# Patient Record
Sex: Male | Born: 1955 | Race: White | Hispanic: No | Smoking: Former smoker
Health system: Southern US, Community
[De-identification: ages and names within clinical notes are randomized; demographics above are authoritative.]

## PROBLEM LIST (undated history)

## (undated) DIAGNOSIS — F419 Anxiety disorder, unspecified: Secondary | ICD-10-CM

## (undated) DIAGNOSIS — N200 Calculus of kidney: Secondary | ICD-10-CM

## (undated) HISTORY — DX: Anxiety disorder, unspecified: F41.9

## (undated) HISTORY — DX: Calculus of kidney: N20.0

## (undated) HISTORY — PX: FRACTURE SURGERY: SHX138

---

## 2011-02-04 HISTORY — PX: THYROIDECTOMY: SHX17

## 2016-08-29 ENCOUNTER — Ambulatory Visit (INDEPENDENT_AMBULATORY_CARE_PROVIDER_SITE_OTHER): Payer: 59 | Admitting: Family Medicine

## 2016-08-29 ENCOUNTER — Encounter: Payer: Self-pay | Admitting: Family Medicine

## 2016-08-29 VITALS — BP 133/86 | HR 81 | Temp 98.4°F | Ht 74.0 in | Wt 178.0 lb

## 2016-08-29 DIAGNOSIS — Z125 Encounter for screening for malignant neoplasm of prostate: Secondary | ICD-10-CM

## 2016-08-29 DIAGNOSIS — R002 Palpitations: Secondary | ICD-10-CM | POA: Diagnosis not present

## 2016-08-29 DIAGNOSIS — R5383 Other fatigue: Secondary | ICD-10-CM | POA: Diagnosis not present

## 2016-08-29 DIAGNOSIS — Z1159 Encounter for screening for other viral diseases: Secondary | ICD-10-CM

## 2016-08-29 DIAGNOSIS — Z13220 Encounter for screening for lipoid disorders: Secondary | ICD-10-CM

## 2016-08-29 DIAGNOSIS — Z0000 Encounter for general adult medical examination without abnormal findings: Secondary | ICD-10-CM | POA: Diagnosis not present

## 2016-08-29 DIAGNOSIS — Z114 Encounter for screening for human immunodeficiency virus [HIV]: Secondary | ICD-10-CM

## 2016-08-29 NOTE — Progress Notes (Signed)
BP (!) 156/95   Pulse 81   Temp 98.4 F (36.9 C)   Ht _0  (1.88 m)   Wt 178 lb (80.7 kg)   BMI 22.85 kg/m    Subjective:    Patient ID: Alvin Sanchez, male    DOB: [birth year], 60 y.o.   MRN: [id number]  HPI: Alvin Sanchez is a 60 y.o. male presenting on 08/29/2016 for Establish Care   HPI Well adult exam Patient is coming in today for well adult exam. It has been quite a few years since he had any kind of checkup. He denies any chest pain, shortness of breath, headaches or vision issues, abdominal complaints, diarrhea, nausea, vomiting, or joint issues. He gets some intermittent palpitations that have been increasing in frequency. He denies any chest pain or shortness of breath associated with them. He cannot pinpoint anything that triggers them or anything that makes them go away but they are happening at least a few times a day now. He is also been having some fatigue and feels like he does not wake up well rested at night. He is also had a hemithyroidectomy and does not know if the fatigue could be related to that. He denies any bowel problems or abdominal problems or urinary problems.Initial blood pressure was 156/95 but repeat was 136/83.  Relevant past medical, surgical, family and social history reviewed and updated as indicated. Interim medical history since our last visit reviewed. Allergies and medications reviewed and updated.  Review of Systems  Constitutional: Positive for fatigue. Negative for chills and fever.  Eyes: Negative for discharge.  Respiratory: Negative for chest tightness, shortness of breath and wheezing.   Cardiovascular: Positive for palpitations. Negative for chest pain and leg swelling.  Musculoskeletal: Negative for back pain and gait problem.  Skin: Negative for rash.  Neurological: Negative for dizziness, weakness, light-headedness and headaches.  All other systems reviewed and are negative.   Per HPI unless specifically indicated  above  Social History   Social History  . Marital status: Divorced    Spouse name: N/A  . Number of children: N/A  . Years of education: N/A   Occupational History  . Not on file.   Social History Main Topics  . Smoking status: Former Smoker    Packs/day: 1.50    Years: 20.00    Quit date: 03/28/2015  . Smokeless tobacco: Former Systems developer    Types: Chew    Quit date: 12/05/2012  . Alcohol use No     Comment: quit since 2012  . Drug use: No  . Sexual activity: Not Currently   Other Topics Concern  . Not on file   Social History Narrative  . No narrative on file    Past Surgical History:  Procedure Laterality Date  . FRACTURE SURGERY     toe  . THYROIDECTOMY Right 02/04/2011   Dr. Hardie Lora, [facility]    Family History  Problem Relation Age of Onset  . Alzheimer's disease Mother   . Cancer Father     throat      Medication List       Accurate as of 08/29/16 10:04 AM. Always use your most recent med list.          Krill Oil 500 MG Caps Take 500 mg by mouth daily.   Melatonin 10 MG Tabs Take 10 mg by mouth at bedtime.   psyllium 58.6 % packet Commonly known as:  METAMUCIL Take 1 packet by mouth daily.  Objective:    BP (!) 156/95   Pulse 81   Temp 98.4 F (36.9 C)   Ht _0  (1.88 m)   Wt 178 lb (80.7 kg)   BMI 22.85 kg/m   Wt Readings from Last 3 Encounters:  08/29/16 178 lb (80.7 kg)    Physical Exam  Constitutional: He is oriented to person, place, and time. He appears well-developed and well-nourished. No distress.  Eyes: Conjunctivae are normal. Right eye exhibits no discharge. No scleral icterus.  Neck: Neck supple. No thyromegaly present.  Cardiovascular: Normal rate, regular rhythm, normal heart sounds and intact distal pulses.   No murmur heard. Pulmonary/Chest: Effort normal and breath sounds normal. No respiratory distress. He has no wheezes.  Abdominal: Soft. Bowel sounds are normal. He exhibits no distension.  There is no tenderness. There is no rebound.  Genitourinary: Rectal exam shows external hemorrhoid (Small external hemorrhoids). Rectal exam shows no mass and no tenderness. Prostate is enlarged (Slight enlargement but smooth). Prostate is not tender.  Musculoskeletal: Normal range of motion. He exhibits no edema.  Lymphadenopathy:    He has no cervical adenopathy.  Neurological: He is alert and oriented to person, place, and time. Coordination normal.  Skin: Skin is warm and dry. No rash noted. He is not diaphoretic.  Psychiatric: He has a normal mood and affect. His behavior is normal.  Nursing note and vitals reviewed.   EKG: Normal sinus rhythm    Assessment & Plan:   Problem List Items Addressed This Visit    None    Visit Diagnoses    Well adult exam    -  Primary   Relevant Orders   CMP14+EGFR   Lipid panel   CBC with Differential/Platelet   PSA, total and free   Hepatitis C antibody   HIV antibody   Vitamin B12   Prostate cancer screening       Relevant Orders   PSA, total and free   Lipid screening       Relevant Orders   Lipid panel   Other fatigue       Relevant Orders   CBC with Differential/Platelet   TSH   Vitamin B12   Need for hepatitis C screening test       Relevant Orders   Hepatitis C antibody   Screening for HIV without presence of risk factors       Relevant Orders   HIV antibody   Palpitations       Relevant Orders   Holter monitor - 48 hour   EKG 12-Lead       Follow up plan: Return if symptoms worsen or fail to improve.  Caryl Pina, MD [facility] 08/29/2016, 10:04 AM

## 2016-08-29 NOTE — Progress Notes (Signed)
48 hr Holter placed Asset [phone]#[id number]

## 2016-08-29 NOTE — Addendum Note (Signed)
Addended by: Bearl MulberryUTHERFORD, Janaya Broy K on: 08/29/2016 10:52 AM   Modules accepted: Orders

## 2016-08-30 ENCOUNTER — Telehealth: Payer: Self-pay | Admitting: Family Medicine

## 2016-08-30 LAB — CBC WITH DIFFERENTIAL/PLATELET
BASOS ABS: 0 10*3/uL (ref 0.0–0.2)
Basos: 0 %
EOS (ABSOLUTE): 0.1 10*3/uL (ref 0.0–0.4)
Eos: 3 %
HEMATOCRIT: 38.4 % (ref 37.5–51.0)
Hemoglobin: 13.1 g/dL (ref 12.6–17.7)
Immature Grans (Abs): 0 10*3/uL (ref 0.0–0.1)
Immature Granulocytes: 0 %
LYMPHS ABS: 1 10*3/uL (ref 0.7–3.1)
Lymphs: 21 %
MCH: 30.3 pg (ref 26.6–33.0)
MCHC: 34.1 g/dL (ref 31.5–35.7)
MCV: 89 fL (ref 79–97)
MONOS ABS: 0.2 10*3/uL (ref 0.1–0.9)
Monocytes: 5 %
NEUTROS ABS: 3.6 10*3/uL (ref 1.4–7.0)
Neutrophils: 71 %
Platelets: 208 10*3/uL (ref 150–379)
RBC: 4.32 x10E6/uL (ref 4.14–5.80)
RDW: 13.1 % (ref 12.3–15.4)
WBC: 5 10*3/uL (ref 3.4–10.8)

## 2016-08-30 LAB — CMP14+EGFR
A/G RATIO: 2 (ref 1.2–2.2)
ALT: 17 IU/L (ref 0–44)
AST: 16 IU/L (ref 0–40)
Albumin: 4.5 g/dL (ref 3.6–4.8)
Alkaline Phosphatase: 60 IU/L (ref 39–117)
BUN/Creatinine Ratio: 16 (ref 10–24)
BUN: 12 mg/dL (ref 8–27)
Bilirubin Total: 0.3 mg/dL (ref 0.0–1.2)
CALCIUM: 8.9 mg/dL (ref 8.6–10.2)
CO2: 26 mmol/L (ref 18–29)
CREATININE: 0.75 mg/dL — AB (ref 0.76–1.27)
Chloride: 102 mmol/L (ref 96–106)
GFR, EST AFRICAN AMERICAN: 114 mL/min/{1.73_m2} (ref >59)
GFR, EST NON AFRICAN AMERICAN: 99 mL/min/{1.73_m2} (ref >59)
GLUCOSE: 95 mg/dL (ref 65–99)
Globulin, Total: 2.2 g/dL (ref 1.5–4.5)
Potassium: 4.1 mmol/L (ref 3.5–5.2)
Sodium: 141 mmol/L (ref 134–144)
TOTAL PROTEIN: 6.7 g/dL (ref 6.0–8.5)

## 2016-08-30 LAB — LIPID PANEL
CHOL/HDL RATIO: 3.1 ratio (ref 0.0–5.0)
Cholesterol, Total: 162 mg/dL (ref 100–199)
HDL: 53 mg/dL (ref >39)
LDL CALC: 81 mg/dL (ref 0–99)
Triglycerides: 138 mg/dL (ref 0–149)
VLDL CHOLESTEROL CAL: 28 mg/dL (ref 5–40)

## 2016-08-30 LAB — VITAMIN B12: Vitamin B-12: 455 pg/mL (ref 211–946)

## 2016-08-30 LAB — TSH: TSH: 1.06 u[IU]/mL (ref 0.450–4.500)

## 2016-08-30 LAB — HIV ANTIBODY (ROUTINE TESTING W REFLEX): HIV Screen 4th Generation wRfx: NONREACTIVE

## 2016-08-30 LAB — PSA, TOTAL AND FREE
PROSTATE SPECIFIC AG, SERUM: 1.8 ng/mL (ref 0.0–4.0)
PSA FREE: 0.22 ng/mL
PSA, Free Pct: 12.2 %

## 2016-08-30 LAB — HEPATITIS C ANTIBODY: Hep C Virus Ab: 0.1 s/co ratio (ref 0.0–0.9)

## 2016-08-30 NOTE — Telephone Encounter (Signed)
Labs mailed

## 2016-09-01 NOTE — Telephone Encounter (Signed)
We received results of heart monitor.  Per Dr. Louanne Skyeettinger, results show PVC's and trigeminy.  Nothing to worry about.  He can be started on a beta blocker if the symptoms are bothering him but no danger.

## 2016-09-01 NOTE — Telephone Encounter (Signed)
Patient's caregiver aware of heart monitor results

## 2016-10-19 ENCOUNTER — Ambulatory Visit: Payer: 59 | Admitting: Family Medicine

## 2016-10-20 ENCOUNTER — Ambulatory Visit (INDEPENDENT_AMBULATORY_CARE_PROVIDER_SITE_OTHER): Payer: 59 | Admitting: Family Medicine

## 2016-10-20 ENCOUNTER — Encounter: Payer: Self-pay | Admitting: Family Medicine

## 2016-10-20 VITALS — BP 135/75 | HR 66 | Temp 98.9°F | Ht 74.0 in | Wt 178.2 lb

## 2016-10-20 DIAGNOSIS — K5732 Diverticulitis of large intestine without perforation or abscess without bleeding: Secondary | ICD-10-CM | POA: Diagnosis not present

## 2016-10-20 DIAGNOSIS — N5089 Other specified disorders of the male genital organs: Secondary | ICD-10-CM

## 2016-10-20 DIAGNOSIS — N509 Disorder of male genital organs, unspecified: Secondary | ICD-10-CM | POA: Diagnosis not present

## 2016-10-20 MED ORDER — CIPROFLOXACIN HCL 500 MG PO TABS: 500.0000 mg | ORAL_TABLET | Freq: Two times a day (BID) | ORAL | 0 refills | Status: AC

## 2016-10-20 MED ORDER — METRONIDAZOLE 500 MG PO TABS: 500.0000 mg | ORAL_TABLET | Freq: Two times a day (BID) | ORAL | 0 refills | Status: AC

## 2016-10-20 NOTE — Progress Notes (Signed)
BP 135/75   Pulse 66   Temp 98.9 F (37.2 C) (Oral)   Ht 6\' 2"  (1.88 m)   Wt 178 lb 4 oz (80.9 kg)   BMI 22.89 kg/m    Subjective:    Patient ID: Alvin Sanchez, male    DOB: 09-28-1956, 60 y.o.   MRN: [id number]  HPI: Alvin CharityJimmy Lee Gilberti is a 60 y.o. male presenting on 10/20/2016 for Abdominal Pain (began 2 - 3 weeks ago, lower abdomen, with episodes of diarrhea)   HPI Diarrhea Patient has been having diarrhea and left lower abdominal pain that is been going on for the past 2 and half weeks. He says it initially started with 10-15 episodes of diarrhea daily and it was very severe and instant and over the past few days he is having 2-5 a day. He did wake up today and is artery had to just this morning of looser stools. They're not watery and there is no blood involved. He denies any nausea or vomiting. He is still mostly eating fine and denies any fevers or chills. He denies any sick contacts that he knows of.  Testicular mass. A few months ago patient noticed that he had a testicular mass just above his right testicle and it has been there for that amount of time. It has been unchanged. It is very firm and somewhat tender to palpation. He has never had anything like this before.  Relevant past medical, surgical, family and social history reviewed and updated as indicated. Interim medical history since our last visit reviewed. Allergies and medications reviewed and updated.  Review of Systems  Constitutional: Negative for chills and fever.  Eyes: Negative for discharge.  Respiratory: Negative for shortness of breath and wheezing.   Cardiovascular: Negative for chest pain and leg swelling.  Gastrointestinal: Positive for abdominal pain and diarrhea. Negative for blood in stool, constipation, nausea and vomiting.  Genitourinary: Negative for dysuria, flank pain and frequency.  Musculoskeletal: Negative for back pain and gait problem.  Skin: Negative for rash.  All other systems  reviewed and are negative.  Per HPI unless specifically indicated above     Objective:    BP 135/75   Pulse 66   Temp 98.9 F (37.2 C) (Oral)   Ht 6\' 2"  (1.88 m)   Wt 178 lb 4 oz (80.9 kg)   BMI 22.89 kg/m   Wt Readings from Last 3 Encounters:  10/20/16 178 lb 4 oz (80.9 kg)  08/29/16 178 lb (80.7 kg)    Physical Exam  Constitutional: He is oriented to person, place, and time. He appears well-developed and well-nourished. No distress.  Eyes: Conjunctivae are normal. Right eye exhibits no discharge. Left eye exhibits no discharge. No scleral icterus.  Cardiovascular: Normal rate, regular rhythm, normal heart sounds and intact distal pulses.   No murmur heard. Pulmonary/Chest: Effort normal and breath sounds normal. No respiratory distress. He has no wheezes. He has no rales.  Abdominal: Soft. Bowel sounds are normal. He exhibits no distension. There is tenderness (Mild left lower quadrant abdominal pressure). There is no rebound and no guarding. Hernia confirmed negative in the right inguinal area and confirmed negative in the left inguinal area.  Genitourinary: Penis normal. Right testis shows mass (small firm nodule in right testicle on the superior side). Right testis shows no swelling and no tenderness. Right testis is descended. Left testis shows no mass, no swelling and no tenderness. Left testis is descended. Circumcised.  Musculoskeletal: Normal range of  motion. He exhibits no edema.  Lymphadenopathy:       Right: No inguinal adenopathy present.       Left: No inguinal adenopathy present.  Neurological: He is alert and oriented to person, place, and time. Coordination normal.  Skin: Skin is warm and dry. No rash noted. He is not diaphoretic.  Psychiatric: He has a normal mood and affect. His behavior is normal.  Nursing note and vitals reviewed.     Assessment & Plan:   Problem List Items Addressed This Visit    None    Visit Diagnoses    Diverticulitis of colon    -   Primary   Relevant Medications   ciprofloxacin (CIPRO) 500 MG tablet   metroNIDAZOLE (FLAGYL) 500 MG tablet   Testicular mass       Relevant Orders   US Art/Ven Flow Abd Pelv Doppler   US Scrotum       Follow up plan: Return if symptoms worsen or fail to improve.  Counseling provided for all of the vaccine components Orders Placed This Encounter  Procedures  . US Art/Ven Flow Abd Pelv Doppler  . US Scrotum    Arville CareJoshua Liz Pinho, MD [facility] 10/20/2016, 8:56 AM

## 2016-10-24 ENCOUNTER — Ambulatory Visit (HOSPITAL_COMMUNITY)
Admission: RE | Admit: 2016-10-24 | Discharge: 2016-10-24 | Disposition: A | Discharge status: Home / Self Care | Payer: 59 | Source: Ambulatory Visit | Attending: Family Medicine | Admitting: Family Medicine

## 2016-10-24 DIAGNOSIS — N503 Cyst of epididymis: Secondary | ICD-10-CM | POA: Diagnosis not present

## 2016-10-24 DIAGNOSIS — N433 Hydrocele, unspecified: Secondary | ICD-10-CM | POA: Diagnosis not present

## 2016-10-24 DIAGNOSIS — N509 Disorder of male genital organs, unspecified: Secondary | ICD-10-CM | POA: Insufficient documentation

## 2016-10-24 DIAGNOSIS — N5089 Other specified disorders of the male genital organs: Secondary | ICD-10-CM

## 2017-10-13 IMAGING — US US ART/VEN ABD/PELV/SCROTUM DOPPLER LTD
1 series · 13 of 25 positions shown · non-contrast
Comparison: None in PACs

CLINICAL DATA: Tender scrotal mass superior to the right testicle
for the past 2 months.

EXAM:
SCROTAL ULTRASOUND
DOPPLER ULTRASOUND OF THE TESTICLES
TECHNIQUE: Complete ultrasound examination of the testicles, epididymis, and
other scrotal structures was performed. Color and spectral Doppler
ultrasound were also utilized to evaluate blood flow to the
testicles.

[Series 1: us art/ven abd/pelv/scrotum doppler ltd · 0.07mm/px · 13 of 69 slices shown]
[im 1/69]
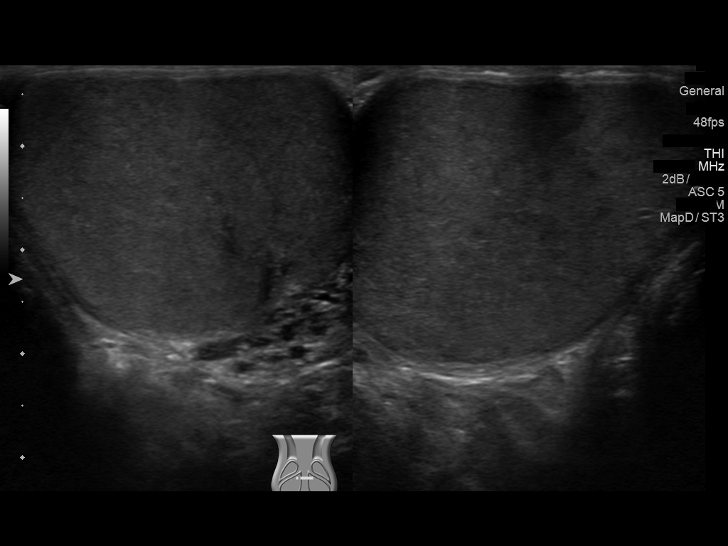
[im 6/69]
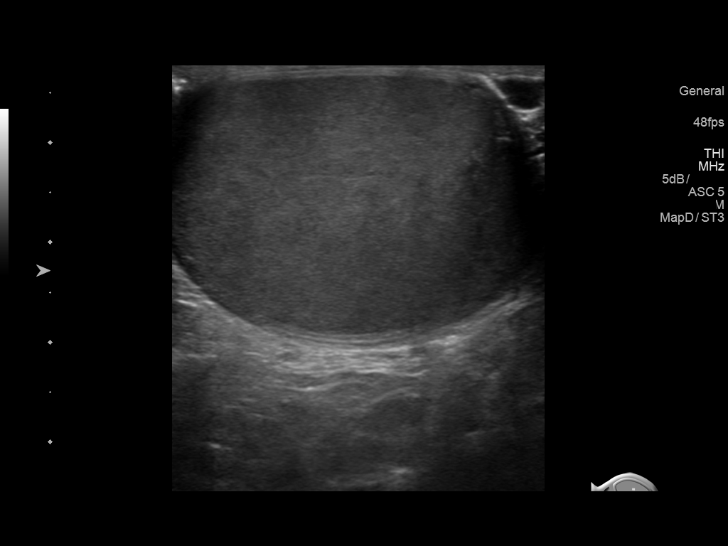
[im 12/69]
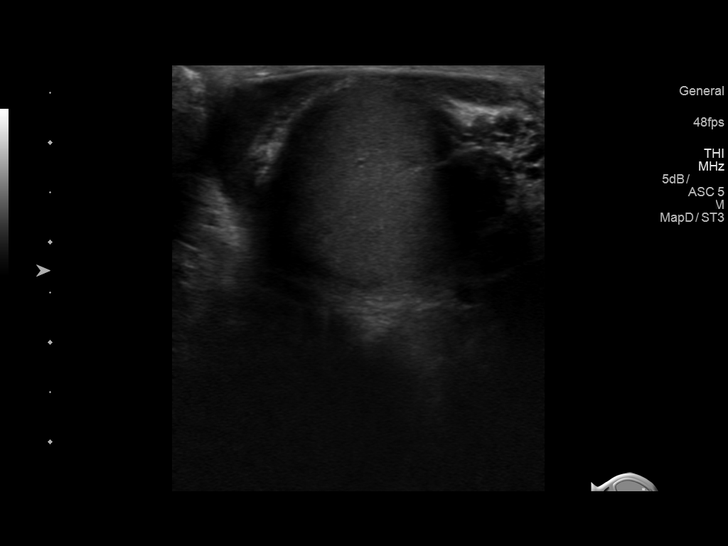
[im 18/69]
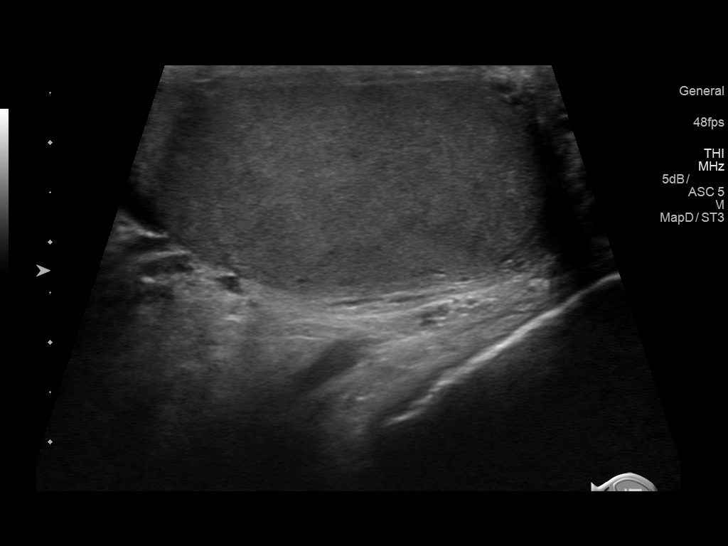
[im 23/69]
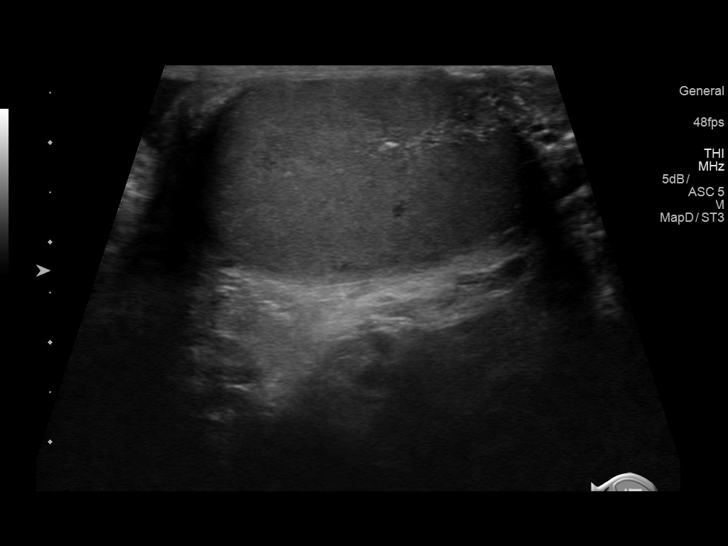
[im 29/69]
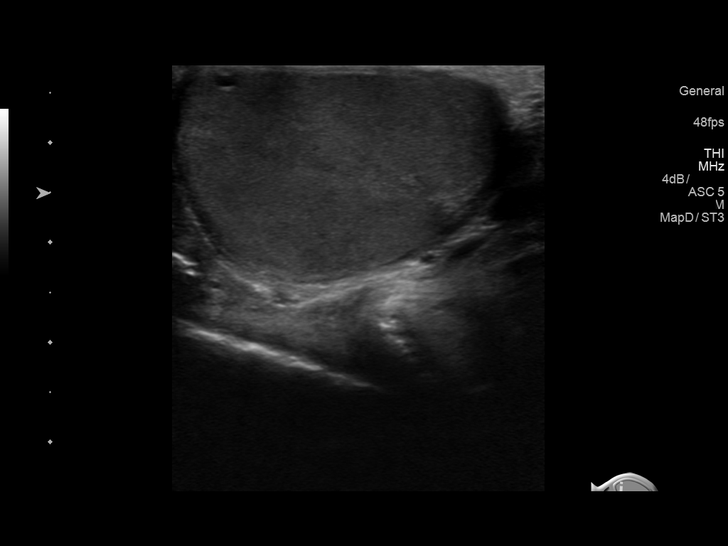
[im 35/69]
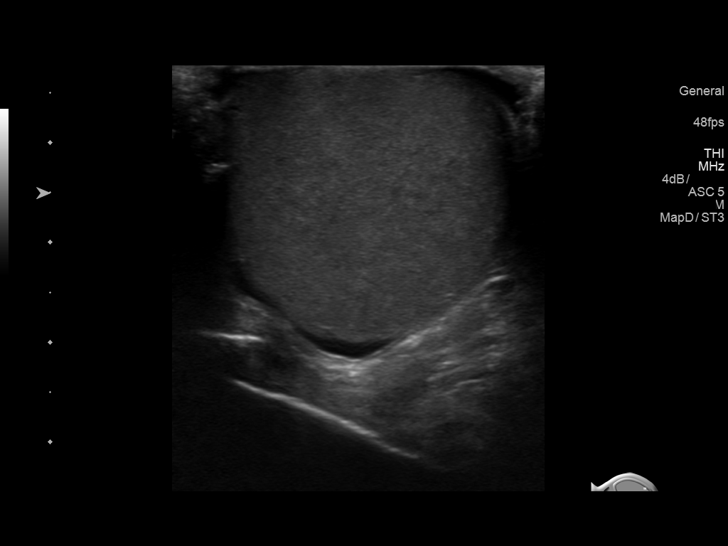
[im 40/69]
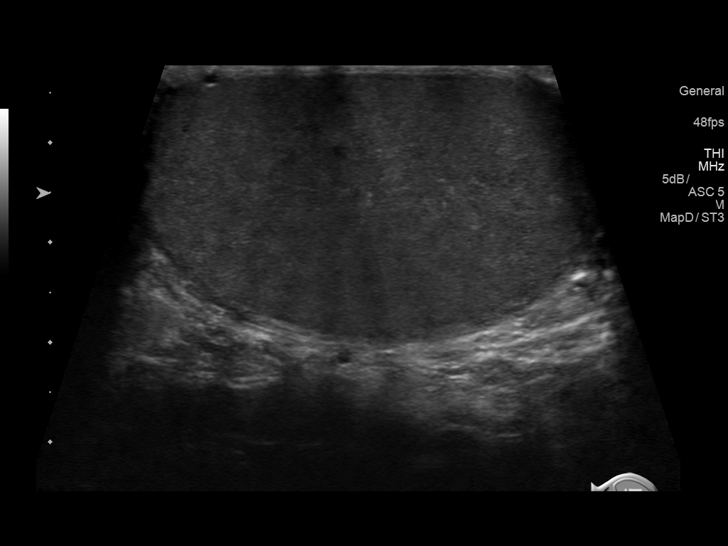
[im 46/69]
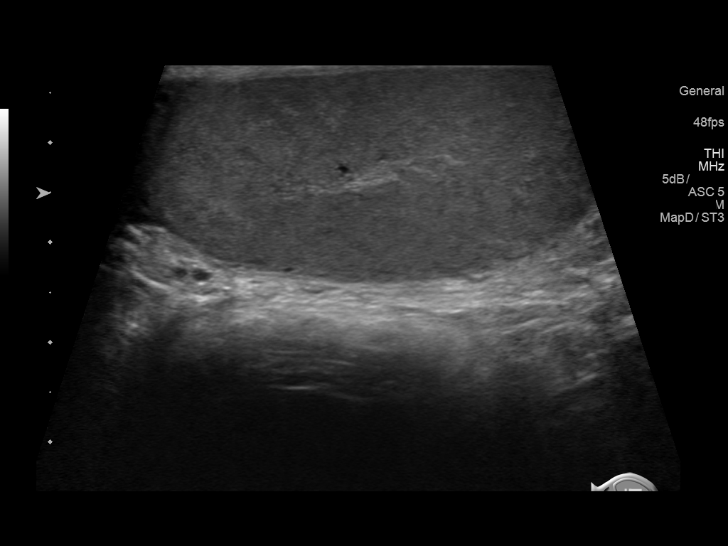
[im 52/69]
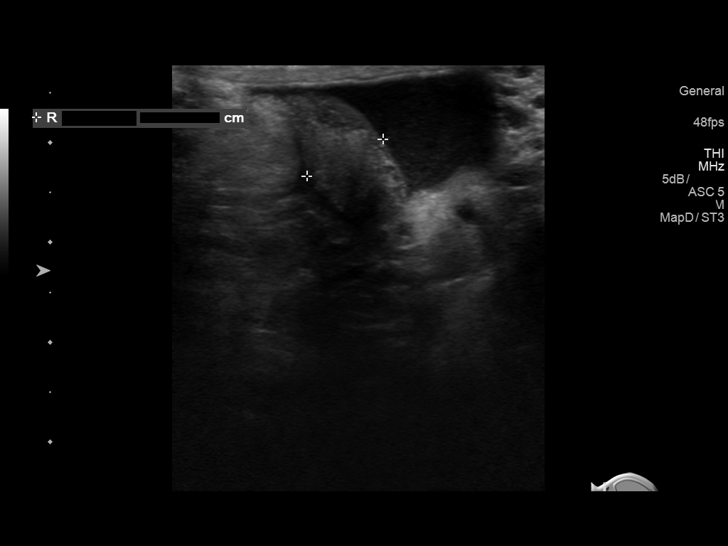
[im 57/69]
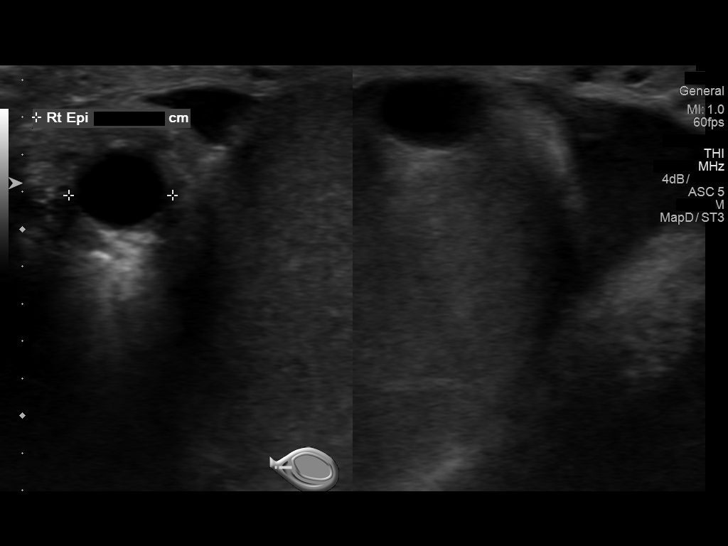
[im 63/69]
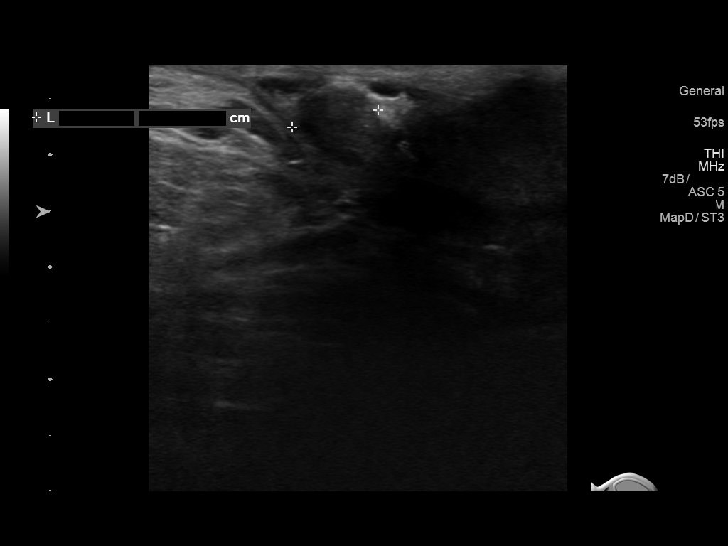
[im 69/69]
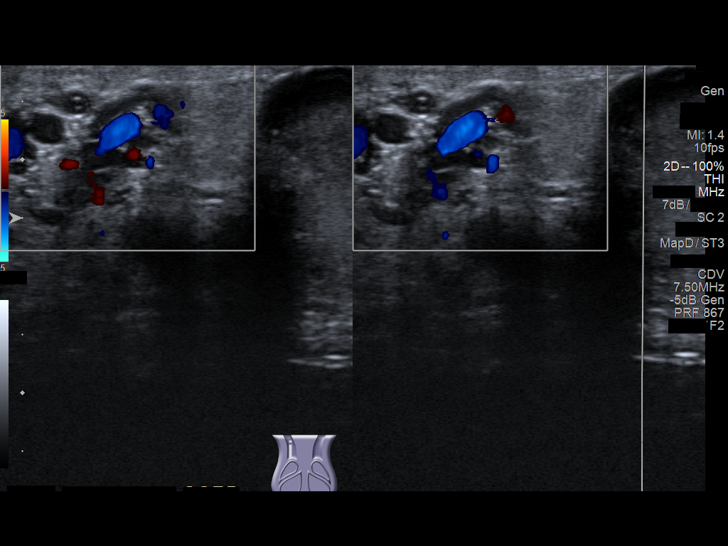

[13 of 25 positions shown; findings below may reference images not displayed]

FINDINGS: Right testicle

Measurements: 4.4 x 2.3 x 3.6 cm. No mass or microlithiasis
visualized.

Left testicle

Measurements: 4.8 x 2.7 x 3.6 cm. No mass or microlithiasis
visualized.

Right epididymis: There is a 6 mm diameter right-sided epididymal
cyst which corresponds to the palpable area of focal tenderness. The
epididymal tissue elsewhere appears normal.

Left epididymis:  Normal in size and appearance.

Hydrocele:  There are small bilateral hydroceles

Varicocele:  None visualized.

Pulsed Doppler interrogation of both testes demonstrates normal low
resistance arterial and venous waveforms bilaterally.
IMPRESSION: 1. There is no right intra testicular mass or evidence of orchitis
or torsion.
2. There is a 6 mm right-sided epididymal cyst which corresponds to
the area of palpable tenderness. No vascular changes to suggest
acute epididymitis.
3. Normal appearance of the left testicle and left epididymis.
4. Small bilateral hydroceles.

## 2019-12-10 HISTORY — PX: ROTATOR CUFF REPAIR: SHX139

## 2019-12-25 ENCOUNTER — Other Ambulatory Visit: Payer: Self-pay

## 2019-12-25 ENCOUNTER — Encounter: Payer: Self-pay | Admitting: Physical Therapy

## 2019-12-25 ENCOUNTER — Ambulatory Visit: Payer: Worker's Compensation | Attending: Orthopedic Surgery | Admitting: Physical Therapy

## 2019-12-25 DIAGNOSIS — M25612 Stiffness of left shoulder, not elsewhere classified: Secondary | ICD-10-CM | POA: Diagnosis not present

## 2019-12-25 DIAGNOSIS — M6281 Muscle weakness (generalized): Secondary | ICD-10-CM | POA: Diagnosis present

## 2019-12-25 NOTE — Therapy (Signed)
[facility] [address] [place], [place], [address] Phone: [phone]   Fax:  [phone]  Physical Therapy Evaluation  Patient Details  Name: Alvin Sanchez MRN: [id number] Date of Birth: 08/04/1956 Referring Provider (PT): Jones Broom, MD   Encounter Date: 12/25/2019  PT End of Session - 12/25/19 1633    Visit Number  1    Number of Visits  18    Date for PT Re-Evaluation  02/05/20    Authorization Type  Worker's compensation    Authorization - Visit Number  1    Authorization - Number of Visits  18    PT Start Time  0815    PT Stop Time  0858    PT Time Calculation (min)  43 min    Equipment Utilized During Treatment  Other (comment)   left sling   Activity Tolerance  Patient tolerated treatment well    Behavior During Therapy  [facility] for tasks assessed/performed       Past Medical History:  Diagnosis Date  . Anxiety   . Kidney stones     Past Surgical History:  Procedure Laterality Date  . FRACTURE SURGERY     toe  . ROTATOR CUFF REPAIR Left 12/10/2019  . THYROIDECTOMY Right 02/04/2011   Dr. Sandi Carne, [facility]    There were no vitals filed for this visit.   Subjective Assessment - 12/25/19 1624    Subjective  COVID-19 screening performed upon arrival. Patient arrives to physical therapy with reports of difficulty performing ADLs secondary to a left rotator cuff repair on 12/10/2019. Patient reports he has minimal to no pain in his left shoulder. He is modified independent for dressing and requires increased time to perform. Patient requires assistance for showering activities. Patient reports independence with donning and doffing sling as well as independence with HEP provided by MD. Patient reports short sharp pain if he moves his shoulder in the wrong way but dissipates quickly. Patient's pain at rest is 0/10. Patient's goals are to improve movement, improve strength, and return to work.    Pertinent  History  left RTC repair 12/10/2019    Limitations  Lifting;House hold activities    Patient Stated Goals  use arm normally again.    Currently in Pain?  No/denies         [facility] PT Assessment - 12/25/19 0001      Assessment   Medical Diagnosis  Traumatic rotator cuff tear left; shoulder impingement, left    Referring Provider (PT)  Jones Broom, MD    Onset Date/Surgical Date  12/10/19    Hand Dominance  Right    Next MD Visit  01/15/2020    Prior Therapy  no      Precautions   Precautions  Shoulder    Precaution Comments  Follow RTC protocol see media      Balance Screen   Has the patient fallen in the past 6 months  Yes    How many times?  1    Has the patient had a decrease in activity level because of a fear of falling?   No    Is the patient reluctant to leave their home because of a fear of falling?   No      Home Environment   Living Environment  Private residence      Prior Function   Level of Independence  Needs assistance with ADLs      Observation/Other Assessments   Focus on Therapeutic  Outcomes (FOTO)   80% limitation      ROM / Strength   AROM / PROM / Strength  PROM      PROM   Overall PROM   Deficits;Due to precautions    PROM Assessment Site  Shoulder    Right/Left Shoulder  Left    Left Shoulder Flexion  90 Degrees    Left Shoulder Internal Rotation  --   to abdomen   Left Shoulder External Rotation  20 Degrees      Palpation   Palpation comment  no tenderness to palpation to left shoulder                Objective measurements completed on examination: See above findings.              PT Education - 12/25/19 1633    Education Details  pendulums, scapular retractions, scapular depression    Person(s) Educated  Patient    Methods  Explanation;Demonstration;Handout    Comprehension  Verbalized understanding;Returned demonstration          PT Long Term Goals - 12/25/19 1638      PT LONG TERM GOAL #1   Title   Patient will be independent with HEP    Time  6    Period  Weeks    Status  New      PT LONG TERM GOAL #2   Title  Patient will demonstrate 140+ degrees of left shoulder flexion AROM to improve ability to perform overhead tasks.    Time  6    Period  Weeks    Status  New      PT LONG TERM GOAL #3   Title  Patient will demonstrate 55+ degrees of left shoulder ER AROM to improve donning and doffing apparel.    Time  6    Period  Weeks    Status  New      PT LONG TERM GOAL #4   Title  Patient will demonstrate 4/5 or greater left shoulder MMT in all planes to improve stability during functional tasks.    Time  6    Period  Weeks    Status  New      PT LONG TERM GOAL #5   Title  Patient will be independent with ADLs and with pain less than or equal to 2/10 in left shoulder.    Time  6    Period  Weeks    Status  New             Plan - 12/25/19 1634    Clinical Impression Statement  Patient is a 64 year old right handed male who presents to physical therapy with limited left shoulder ROM and strength due to a left shoulder RTC repair on 12/10/2019. Patient was able to achieve ranges into flexion and ER per protocol with no reports of pain. Patient noted with rounded shoulders and forward head. Patient provided HEP as well as educated on plan of care to address deficits. Patient reported understanding. Patient would benefit from skilled physical therapy to address deficits and address patient's goals.    Personal Factors and Comorbidities  Age;Comorbidity 1    Comorbidities  left RTC repair 12/10/2019    Examination-Activity Limitations  Bathing;Reach Overhead;Carry;Dressing;Hygiene/Grooming;Lift    Stability/Clinical Decision Making  Stable/Uncomplicated    Clinical Decision Making  Low    Rehab Potential  Excellent    PT Frequency  3x / week    PT Duration  6 weeks    PT Treatment/Interventions  ADLs/Self Care Home Management;Cryotherapy;Electrical Stimulation;Moist  Heat;Ultrasound;Neuromuscular re-education;Therapeutic activities;Therapeutic exercise;Functional mobility training;Patient/family education;Manual techniques;Vasopneumatic Device;Taping;Passive range of motion    PT Next Visit Plan  PROM to left shoulder, NO ER >20 DEGREES FOR 6 WEEKS PER PROTOCOL, modalities PRN for pain relief    PT Home Exercise Plan  see patient education section    Consulted and Agree with Plan of Care  Patient       Patient will benefit from skilled therapeutic intervention in order to improve the following deficits and impairments:  Decreased activity tolerance, Decreased strength, Decreased range of motion, Pain, Impaired UE functional use, Postural dysfunction  Visit Diagnosis: Stiffness of left shoulder, not elsewhere classified - Plan: PT plan of care cert/re-cert  Muscle weakness (generalized) - Plan: PT plan of care cert/re-cert     Problem List There are no problems to display for this patient.   [name], PT, DPT 12/25/2019, 4:43 PM  [facility] [address] [place], [place], [address] Phone: [phone]   Fax:  [phone]  Name: Alvin Sanchez MRN: [id number] Date of Birth: July 14, 1956

## 2019-12-27 ENCOUNTER — Ambulatory Visit: Payer: Worker's Compensation | Admitting: Physical Therapy

## 2019-12-27 ENCOUNTER — Other Ambulatory Visit: Payer: Self-pay

## 2019-12-27 DIAGNOSIS — M25612 Stiffness of left shoulder, not elsewhere classified: Secondary | ICD-10-CM | POA: Diagnosis not present

## 2019-12-27 DIAGNOSIS — M6281 Muscle weakness (generalized): Secondary | ICD-10-CM

## 2019-12-27 NOTE — Therapy (Signed)
[facility] [address] [place], [place], [address] Phone: [phone]   Fax:  [phone]  Physical Therapy Treatment  Patient Details  Name: Alvin Sanchez MRN: [id number] Date of Birth: 05/20/1956 Referring Provider (PT): Jones Broom, MD   Encounter Date: 12/27/2019  PT End of Session - 12/27/19 1241    Visit Number  2    Number of Visits  18    Date for PT Re-Evaluation  02/05/20    Authorization Type  Worker's compensation    Authorization - Visit Number  2    Authorization - Number of Visits  18    PT Start Time  0945    PT Stop Time  1038    PT Time Calculation (min)  53 min    Equipment Utilized During Treatment  Other (comment)   left sling   Activity Tolerance  Patient tolerated treatment well    Behavior During Therapy  [facility] for tasks assessed/performed       Past Medical History:  Diagnosis Date  . Anxiety   . Kidney stones     Past Surgical History:  Procedure Laterality Date  . FRACTURE SURGERY     toe  . ROTATOR CUFF REPAIR Left 12/10/2019  . THYROIDECTOMY Right 02/04/2011   Dr. Sandi Carne, [facility]    There were no vitals filed for this visit.  Subjective Assessment - 12/27/19 1242    Subjective  COVID-19 screening performed upon arrival. Patient reported no complaints and reported being complaint with HEP.    Pertinent History  left RTC repair 12/10/2019    Limitations  Lifting;House hold activities    Patient Stated Goals  use arm normally again.    Currently in Pain?  No/denies         [facility] PT Assessment - 12/27/19 0001      Assessment   Medical Diagnosis  Traumatic rotator cuff tear left; shoulder impingement, left    Referring Provider (PT)  Jones Broom, MD    Onset Date/Surgical Date  12/10/19    Hand Dominance  Right    Next MD Visit  01/15/2020    Prior Therapy  no      Precautions   Precautions  Shoulder    Precaution Comments  Follow RTC protocol see media                    [facility] Adult PT Treatment/Exercise - 12/27/19 0001      Exercises   Exercises  Shoulder      Modalities   Modalities  Vasopneumatic      Vasopneumatic   Number Minutes Vasopneumatic   15 minutes    Vasopnuematic Location   Shoulder    Vasopneumatic Pressure  Low    Vasopneumatic Temperature   56      Manual Therapy   Manual Therapy  Passive ROM    Passive ROM  left shoulder PROM into flexion and ER into 20 degrees per protocol. intermittent oscillations for muscle relaxation                  PT Long Term Goals - 12/25/19 1638      PT LONG TERM GOAL #1   Title  Patient will be independent with HEP    Time  6    Period  Weeks    Status  New      PT LONG TERM GOAL #2   Title  Patient will demonstrate 140+ degrees of left  shoulder flexion AROM to improve ability to perform overhead tasks.    Time  6    Period  Weeks    Status  New      PT LONG TERM GOAL #3   Title  Patient will demonstrate 55+ degrees of left shoulder ER AROM to improve donning and doffing apparel.    Time  6    Period  Weeks    Status  New      PT LONG TERM GOAL #4   Title  Patient will demonstrate 4/5 or greater left shoulder MMT in all planes to improve stability during functional tasks.    Time  6    Period  Weeks    Status  New      PT LONG TERM GOAL #5   Title  Patient will be independent with ADLs and with pain less than or equal to 2/10 in left shoulder.    Time  6    Period  Weeks    Status  New            Plan - 12/27/19 1243    Clinical Impression Statement  Patient responded well to therapy session with no reports of increased pain just a slight "pulling" during PROM. Patient noted with smooth arcs of motion and was able to achieve 20 degrees of ER per protocol with no complaints. Patient educated on vasopneumatic device for edema control. Patient reported understanding and was found with normal responses upon removal.    Personal Factors and  Comorbidities  Age;Comorbidity 1    Comorbidities  left RTC repair 12/10/2019    Examination-Activity Limitations  Bathing;Reach Overhead;Carry;Dressing;Hygiene/Grooming;Lift    Stability/Clinical Decision Making  Stable/Uncomplicated    Clinical Decision Making  Low    Rehab Potential  Excellent    PT Frequency  3x / week    PT Treatment/Interventions  ADLs/Self Care Home Management;Cryotherapy;Electrical Stimulation;Moist Heat;Ultrasound;Neuromuscular re-education;Therapeutic activities;Therapeutic exercise;Functional mobility training;Patient/family education;Manual techniques;Vasopneumatic Device;Taping;Passive range of motion    PT Next Visit Plan  PROM to left shoulder, NO ER >20 DEGREES FOR 6 WEEKS PER PROTOCOL, modalities PRN for pain relief    PT Home Exercise Plan  see patient education section    Consulted and Agree with Plan of Care  Patient       Patient will benefit from skilled therapeutic intervention in order to improve the following deficits and impairments:  Decreased activity tolerance, Decreased strength, Decreased range of motion, Pain, Impaired UE functional use, Postural dysfunction  Visit Diagnosis: Stiffness of left shoulder, not elsewhere classified  Muscle weakness (generalized)     Problem List There are no problems to display for this patient.   [name], PT, DPT 12/27/2019, 12:48 PM  [facility] [address] [place], [place], [address] Phone: [phone]   Fax:  [phone]  Name: Kamaron Deskins MRN: [id number] Date of Birth: Oct 18, 1956

## 2019-12-30 ENCOUNTER — Ambulatory Visit: Payer: Worker's Compensation | Admitting: Physical Therapy

## 2019-12-30 ENCOUNTER — Other Ambulatory Visit: Payer: Self-pay

## 2019-12-30 ENCOUNTER — Encounter: Payer: Self-pay | Admitting: Physical Therapy

## 2019-12-30 DIAGNOSIS — M6281 Muscle weakness (generalized): Secondary | ICD-10-CM

## 2019-12-30 DIAGNOSIS — M25612 Stiffness of left shoulder, not elsewhere classified: Secondary | ICD-10-CM

## 2019-12-30 NOTE — Therapy (Signed)
[facility] [address] [place], [place], [address] Phone: [phone]   Fax:  [phone]  Physical Therapy Treatment  Patient Details  Name: Alvin Sanchez MRN: [id number] Date of Birth: March 06, 1956 Referring Provider (PT): Jones Broom, MD   Encounter Date: 12/30/2019  PT End of Session - 12/30/19 0750    Visit Number  3    Number of Visits  18    Date for PT Re-Evaluation  02/05/20    Authorization Type  Worker's compensation    Authorization - Visit Number  3    Authorization - Number of Visits  18    PT Start Time  0736    PT Stop Time  0817    PT Time Calculation (min)  41 min    Activity Tolerance  Patient tolerated treatment well    Behavior During Therapy  [facility] for tasks assessed/performed       Past Medical History:  Diagnosis Date  . Anxiety   . Kidney stones     Past Surgical History:  Procedure Laterality Date  . FRACTURE SURGERY     toe  . ROTATOR CUFF REPAIR Left 12/10/2019  . THYROIDECTOMY Right 02/04/2011   Dr. Sandi Carne, [facility]    There were no vitals filed for this visit.  Subjective Assessment - 12/30/19 0737    Subjective  COVID-19 screening performed upon arrival. Patient reported no pain upon arrival. Patient reported he felt and heard a pop in shoulder after he reached for seatbelt in car    Pertinent History  left RTC repair 12/10/2019    Limitations  Lifting;House hold activities    Patient Stated Goals  use arm normally again.    Currently in Pain?  No/denies                       [facility] Adult PT Treatment/Exercise - 12/30/19 0001      Vasopneumatic   Number Minutes Vasopneumatic   15 minutes    Vasopnuematic Location   Shoulder    Vasopneumatic Pressure  Low    Vasopneumatic Temperature   34 for edema      Manual Therapy   Manual Therapy  Passive ROM    Passive ROM  left shoulder PROM into flexion and ER into 20 degrees per protocol. intermittent  oscillations for muscle relaxation                  PT Long Term Goals - 12/30/19 3557      PT LONG TERM GOAL #1   Title  Patient will be independent with HEP    Time  6    Period  Weeks    Status  On-going      PT LONG TERM GOAL #2   Title  Patient will demonstrate 140+ degrees of left shoulder flexion AROM to improve ability to perform overhead tasks.    Time  6    Period  Weeks    Status  On-going      PT LONG TERM GOAL #3   Title  Patient will demonstrate 55+ degrees of left shoulder ER AROM to improve donning and doffing apparel.    Time  6    Period  Weeks    Status  On-going      PT LONG TERM GOAL #4   Title  Patient will demonstrate 4/5 or greater left shoulder MMT in all planes to improve stability during functional tasks.  Time  6    Period  Weeks    Status  On-going      PT LONG TERM GOAL #5   Title  Patient will be independent with ADLs and with pain less than or equal to 2/10 in left shoulder.    Time  6    Period  Weeks    Status  On-going            Plan - 12/30/19 0804    Clinical Impression Statement  Patient tolerated treatment well today. Patient reported no pain pre or post treatment only minimal muscle soreness overall. Patient had a smooth arcs of ROM with all movements and was able to achieve 20 degrees of ER per protocol. Patient has reported doing HEP as instructed by PT. Current goals ongoing at this time.    Personal Factors and Comorbidities  Age;Comorbidity 1    Comorbidities  left RTC repair 12/10/2019    Examination-Activity Limitations  Bathing;Reach Overhead;Carry;Dressing;Hygiene/Grooming;Lift    Stability/Clinical Decision Making  Stable/Uncomplicated    Rehab Potential  Excellent    PT Frequency  3x / week    PT Duration  6 weeks    PT Treatment/Interventions  ADLs/Self Care Home Management;Cryotherapy;Electrical Stimulation;Moist Heat;Ultrasound;Neuromuscular re-education;Therapeutic activities;Therapeutic  exercise;Functional mobility training;Patient/family education;Manual techniques;Vasopneumatic Device;Taping;Passive range of motion    PT Next Visit Plan  cont with PROM to left shoulder, NO ER >20 DEGREES FOR 6 WEEKS PER PROTOCOL, modalities PRN for pain relief (12/10/19 surgury/ 12/31/19 3 weeks)    Consulted and Agree with Plan of Care  Patient       Patient will benefit from skilled therapeutic intervention in order to improve the following deficits and impairments:  Decreased activity tolerance, Decreased strength, Decreased range of motion, Pain, Impaired UE functional use, Postural dysfunction  Visit Diagnosis: Stiffness of left shoulder, not elsewhere classified  Muscle weakness (generalized)     Problem List There are no problems to display for this patient.   Phillips Climes, PTA 12/30/2019, 8:19 AM  [facility] [place], [place], [address] Phone: [phone]   Fax:  [phone]  Name: Alvin Sanchez MRN: [id number] Date of Birth: 1956/08/19

## 2020-01-02 ENCOUNTER — Ambulatory Visit: Payer: Worker's Compensation | Admitting: Physical Therapy

## 2020-01-02 ENCOUNTER — Other Ambulatory Visit: Payer: Self-pay

## 2020-01-02 ENCOUNTER — Encounter: Payer: Self-pay | Admitting: Physical Therapy

## 2020-01-02 DIAGNOSIS — M25612 Stiffness of left shoulder, not elsewhere classified: Secondary | ICD-10-CM | POA: Diagnosis not present

## 2020-01-02 DIAGNOSIS — M6281 Muscle weakness (generalized): Secondary | ICD-10-CM

## 2020-01-02 NOTE — Therapy (Signed)
[facility] [place], [place], [address] Phone: [phone]   Fax:  [phone]  Physical Therapy Treatment  Patient Details  Name: Alvin Sanchez MRN: [id number] Date of Birth: 06/28/1956 Referring Provider (PT): Tania Ade, MD   Encounter Date: 01/02/2020  PT End of Session - 01/02/20 0741    Visit Number  4    Number of Visits  18    Date for PT Re-Evaluation  02/05/20    Authorization Type  Worker's compensation    PT Start Time  0729    PT Stop Time  0809    PT Time Calculation (min)  40 min    Activity Tolerance  Patient tolerated treatment well    Behavior During Therapy  [facility] for tasks assessed/performed       Past Medical History:  Diagnosis Date  . Anxiety   . Kidney stones     Past Surgical History:  Procedure Laterality Date  . FRACTURE SURGERY     toe  . ROTATOR CUFF REPAIR Left 12/10/2019  . THYROIDECTOMY Right 02/04/2011   Dr. Hardie Lora, [facility]    There were no vitals filed for this visit.  Subjective Assessment - 01/02/20 0731    Subjective  COVID-19 screening performed upon arrival. Patient reported no pain upon arrival and doing well. Soreness reported with any movement up to 2-3/10    Pertinent History  left RTC repair 12/10/2019    Limitations  Lifting;House hold activities    Patient Stated Goals  use arm normally again.    Currently in Pain?  No/denies                       [facility] Adult PT Treatment/Exercise - 01/02/20 0001      Modalities   Modalities  Electrical Stimulation;Vasopneumatic      Electrical Stimulation   Electrical Stimulation Location  left shoulder    Electrical Stimulation Action  premod    Electrical Stimulation Parameters  1-10hz  x15 min    Electrical Stimulation Goals  Edema;Pain      Vasopneumatic   Number Minutes Vasopneumatic   15 minutes    Vasopnuematic Location   Shoulder    Vasopneumatic Pressure  Low    Vasopneumatic  Temperature   34 for edema      Manual Therapy   Manual Therapy  Passive ROM    Passive ROM  left shoulder PROM into flexion and ER into 20 degrees per protocol. intermittent oscillations for muscle relaxation                  PT Long Term Goals - 12/30/19 9417      PT LONG TERM GOAL #1   Title  Patient will be independent with HEP    Time  6    Period  Weeks    Status  On-going      PT LONG TERM GOAL #2   Title  Patient will demonstrate 140+ degrees of left shoulder flexion AROM to improve ability to perform overhead tasks.    Time  6    Period  Weeks    Status  On-going      PT LONG TERM GOAL #3   Title  Patient will demonstrate 55+ degrees of left shoulder ER AROM to improve donning and doffing apparel.    Time  6    Period  Weeks    Status  On-going      PT LONG  TERM GOAL #4   Title  Patient will demonstrate 4/5 or greater left shoulder MMT in all planes to improve stability during functional tasks.    Time  6    Period  Weeks    Status  On-going      PT LONG TERM GOAL #5   Title  Patient will be independent with ADLs and with pain less than or equal to 2/10 in left shoulder.    Time  6    Period  Weeks    Status  On-going            Plan - 01/02/20 0755    Clinical Impression Statement  Patient tolerated treatment well today. No pain pre or post treatment only soreness in left shoulder with movement up to 2-3/10. Patient had a smooth ars of motion with all movements and able to ahieve 20 degrees of ER per protocol. Patient responded well upon removal of modalities today. Goals progressin at this time.    Personal Factors and Comorbidities  Age;Comorbidity 1    Comorbidities  left RTC repair 12/10/2019    Examination-Activity Limitations  Bathing;Reach Overhead;Carry;Dressing;Hygiene/Grooming;Lift    Stability/Clinical Decision Making  Stable/Uncomplicated    Rehab Potential  Excellent    PT Frequency  3x / week    PT Duration  6 weeks    PT  Treatment/Interventions  ADLs/Self Care Home Management;Cryotherapy;Electrical Stimulation;Moist Heat;Ultrasound;Neuromuscular re-education;Therapeutic activities;Therapeutic exercise;Functional mobility training;Patient/family education;Manual techniques;Vasopneumatic Device;Taping;Passive range of motion    PT Next Visit Plan  cont with PROM to left shoulder, NO ER >20 DEGREES FOR 6 WEEKS PER PROTOCOL, modalities PRN for pain relief (12/10/19 surgury/ 12/31/19 3 weeks) MD 01/15/20 F/U    Consulted and Agree with Plan of Care  Patient       Patient will benefit from skilled therapeutic intervention in order to improve the following deficits and impairments:  Decreased activity tolerance, Decreased strength, Decreased range of motion, Pain, Impaired UE functional use, Postural dysfunction  Visit Diagnosis: Stiffness of left shoulder, not elsewhere classified  Muscle weakness (generalized)     Problem List There are no problems to display for this patient.   Hermelinda Dellen, PTA 01/02/2020, 8:11 AM  [facility] [address] [place], [place], [address] Phone: [phone]   Fax:  [phone]  Name: Alvin Sanchez MRN: [id number] Date of Birth: 06-14-1956

## 2020-01-07 ENCOUNTER — Encounter: Payer: Self-pay | Admitting: Physical Therapy

## 2020-01-07 ENCOUNTER — Other Ambulatory Visit: Payer: Self-pay

## 2020-01-07 ENCOUNTER — Ambulatory Visit: Payer: Worker's Compensation | Attending: Orthopedic Surgery | Admitting: Physical Therapy

## 2020-01-07 DIAGNOSIS — M25612 Stiffness of left shoulder, not elsewhere classified: Secondary | ICD-10-CM | POA: Insufficient documentation

## 2020-01-07 DIAGNOSIS — M6281 Muscle weakness (generalized): Secondary | ICD-10-CM | POA: Diagnosis present

## 2020-01-07 NOTE — Therapy (Signed)
[facility] [address] [place], [place], [address] Phone: [phone]   Fax:  [phone]  Physical Therapy Treatment  Patient Details  Name: Alvin Sanchez MRN: [id number] Date of Birth: [birth year] Referring Provider (PT): Jones Broom, MD   Encounter Date: 01/07/2020  PT End of Session - 01/07/20 0812    Visit Number  5    Number of Visits  18    Date for PT Re-Evaluation  02/05/20    Authorization Type  Worker's compensation    Authorization - Visit Number  5    Authorization - Number of Visits  18    PT Start Time  0731    PT Stop Time  0818    PT Time Calculation (min)  47 min    Equipment Utilized During Treatment  Other (comment)   left Sling   Activity Tolerance  Patient tolerated treatment well    Behavior During Therapy  [facility] for tasks assessed/performed       Past Medical History:  Diagnosis Date  . Anxiety   . Kidney stones     Past Surgical History:  Procedure Laterality Date  . FRACTURE SURGERY     toe  . ROTATOR CUFF REPAIR Left 12/10/2019  . THYROIDECTOMY Right 02/04/2011   Dr. Sandi Carne, [facility]    There were no vitals filed for this visit.  Subjective Assessment - 01/07/20 0808    Subjective  COVID-19 screening performed upon arrival. Patient arrives with no pain but does report intermittent "popping" with exercises.    Pertinent History  left RTC repair 12/10/2019    Limitations  Lifting;House hold activities    Patient Stated Goals  use arm normally again.    Currently in Pain?  No/denies         [facility] PT Assessment - 01/07/20 0001      Assessment   Medical Diagnosis  Traumatic rotator cuff tear left; shoulder impingement, left    Referring Provider (PT)  Jones Broom, MD    Onset Date/Surgical Date  12/10/19    Hand Dominance  Right    Next MD Visit  01/15/2020    Prior Therapy  no      Precautions   Precautions  Shoulder    Precaution Comments  Follow RTC protocol see  media                   [facility] Adult PT Treatment/Exercise - 01/07/20 0001      Modalities   Modalities  Electrical Stimulation;Vasopneumatic      Electrical Stimulation   Electrical Stimulation Location  left shoulder    Electrical Stimulation Action  pre-mod    Electrical Stimulation Parameters  80-150 hz x15 mins    Electrical Stimulation Goals  Edema      Vasopneumatic   Number Minutes Vasopneumatic   15 minutes    Vasopnuematic Location   Shoulder    Vasopneumatic Pressure  Low    Vasopneumatic Temperature   48      Manual Therapy   Manual Therapy  Passive ROM    Passive ROM  left shoulder PROM into flexion and ER into 20 degrees per protocol. intermittent oscillations for muscle relaxation                  PT Long Term Goals - 12/30/19 0626      PT LONG TERM GOAL #1   Title  Patient will be independent with HEP    Time  6    Period  Weeks    Status  On-going      PT LONG TERM GOAL #2   Title  Patient will demonstrate 140+ degrees of left shoulder flexion AROM to improve ability to perform overhead tasks.    Time  6    Period  Weeks    Status  On-going      PT LONG TERM GOAL #3   Title  Patient will demonstrate 55+ degrees of left shoulder ER AROM to improve donning and doffing apparel.    Time  6    Period  Weeks    Status  On-going      PT LONG TERM GOAL #4   Title  Patient will demonstrate 4/5 or greater left shoulder MMT in all planes to improve stability during functional tasks.    Time  6    Period  Weeks    Status  On-going      PT LONG TERM GOAL #5   Title  Patient will be independent with ADLs and with pain less than or equal to 2/10 in left shoulder.    Time  6    Period  Weeks    Status  On-going            Plan - 01/07/20 3382    Clinical Impression Statement  Patient responded well to therapy with minimal reports of pain. Patient continues to have smooth arcs of motion with PROM. Patient educated on continuing  wearing the sling until next post-op visit. Patient reported understanding. No adverse affects upon the removal of modalities.    Personal Factors and Comorbidities  Age;Comorbidity 1    Comorbidities  left RTC repair 12/10/2019    Examination-Activity Limitations  Bathing;Reach Overhead;Carry;Dressing;Hygiene/Grooming;Lift    Stability/Clinical Decision Making  Stable/Uncomplicated    Clinical Decision Making  Low    Rehab Potential  Excellent    PT Frequency  3x / week    PT Duration  6 weeks    PT Treatment/Interventions  ADLs/Self Care Home Management;Cryotherapy;Electrical Stimulation;Moist Heat;Ultrasound;Neuromuscular re-education;Therapeutic activities;Therapeutic exercise;Functional mobility training;Patient/family education;Manual techniques;Vasopneumatic Device;Taping;Passive range of motion    PT Next Visit Plan  cont with PROM to left shoulder, NO ER >20 DEGREES FOR 6 WEEKS PER PROTOCOL, modalities PRN for pain relief (12/10/19 surgury/ 12/31/19 3 weeks) MD 01/15/20 F/U    PT Home Exercise Plan  see patient education section    Consulted and Agree with Plan of Care  Patient       Patient will benefit from skilled therapeutic intervention in order to improve the following deficits and impairments:  Decreased activity tolerance, Decreased strength, Decreased range of motion, Pain, Impaired UE functional use, Postural dysfunction  Visit Diagnosis: Stiffness of left shoulder, not elsewhere classified  Muscle weakness (generalized)     Problem List There are no problems to display for this patient.   [name], PT, DPT 01/07/2020, 9:07 AM  [facility] [address] [place], [place], [address] Phone: [phone]   Fax:  [phone]  Name: Alvin Sanchez MRN: [id number] Date of Birth: 29-Jul-1956

## 2020-01-09 ENCOUNTER — Other Ambulatory Visit: Payer: Self-pay

## 2020-01-09 ENCOUNTER — Ambulatory Visit: Payer: Worker's Compensation | Admitting: Physical Therapy

## 2020-01-09 ENCOUNTER — Encounter: Payer: Self-pay | Admitting: Physical Therapy

## 2020-01-09 DIAGNOSIS — M25612 Stiffness of left shoulder, not elsewhere classified: Secondary | ICD-10-CM | POA: Diagnosis not present

## 2020-01-09 DIAGNOSIS — M6281 Muscle weakness (generalized): Secondary | ICD-10-CM

## 2020-01-09 NOTE — Therapy (Signed)
[facility] [place], [place], [address] Phone: [phone]   Fax:  [phone]  Physical Therapy Treatment  Patient Details  Name: Alvin Sanchez MRN: [id number] Date of Birth: 09-05-1956 Referring Provider (PT): Tania Ade, MD   Encounter Date: 01/09/2020  PT End of Session - 01/09/20 0744    Visit Number  6    Number of Visits  18    Date for PT Re-Evaluation  02/05/20    Authorization Type  Worker's compensation    Authorization - Visit Number  6    Authorization - Number of Visits  18    PT Start Time  0732    PT Stop Time  0811    PT Time Calculation (min)  39 min    Activity Tolerance  Patient tolerated treatment well    Behavior During Therapy  [facility] for tasks assessed/performed       Past Medical History:  Diagnosis Date  . Anxiety   . Kidney stones     Past Surgical History:  Procedure Laterality Date  . FRACTURE SURGERY     toe  . ROTATOR CUFF REPAIR Left 12/10/2019  . THYROIDECTOMY Right 02/04/2011   Dr. Hardie Lora, [facility]    There were no vitals filed for this visit.  Subjective Assessment - 01/09/20 0736    Subjective  COVID-19 screening performed upon arrival. Patient arrives with no pain today and no new complaints    Pertinent History  left RTC repair 12/10/2019    Limitations  Lifting;House hold activities    Patient Stated Goals  use arm normally again.    Currently in Pain?  No/denies         [facility] PT Assessment - 01/09/20 0001      PROM   PROM Assessment Site  Shoulder    Right/Left Shoulder  Left    Left Shoulder External Rotation  20 Degrees                   [facility] Adult PT Treatment/Exercise - 01/09/20 0001      Electrical Stimulation   Electrical Stimulation Location  left shoulder    Electrical Stimulation Action  premod    Electrical Stimulation Parameters  80-150hz  x10 min    Electrical Stimulation Goals  Edema      Vasopneumatic   Number  Minutes Vasopneumatic   10 minutes    Vasopnuematic Location   Shoulder    Vasopneumatic Pressure  Low    Vasopneumatic Temperature   34 for edema      Manual Therapy   Manual Therapy  Passive ROM    Passive ROM  left shoulder PROM into flexion and ER into 20 degrees per protocol. intermittent oscillations for muscle relaxation                  PT Long Term Goals - 01/09/20 0743      PT LONG TERM GOAL #1   Title  Patient will be independent with HEP    Time  6    Period  Weeks    Status  On-going      PT LONG TERM GOAL #2   Title  Patient will demonstrate 140+ degrees of left shoulder flexion AROM to improve ability to perform overhead tasks.    Time  6    Period  Weeks    Status  On-going      PT LONG TERM GOAL #3   Title  Patient  will demonstrate 55+ degrees of left shoulder ER AROM to improve donning and doffing apparel.    Time  6    Period  Weeks    Status  On-going   limited to 20 degrees per protocol 01/09/20     PT LONG TERM GOAL #4   Title  Patient will demonstrate 4/5 or greater left shoulder MMT in all planes to improve stability during functional tasks.    Time  6    Period  Weeks    Status  On-going      PT LONG TERM GOAL #5   Title  Patient will be independent with ADLs and with pain less than or equal to 2/10 in left shoulder.    Time  6    Period  Weeks    Status  On-going            Plan - 01/09/20 0744    Clinical Impression Statement  Patient tolerated treatment well today. Patients arrived with sling on shoulder and no pain. Patient only reportes soreness with ROM and movement up to a 2/10 at most. Patients PROM continues to have a smooth arch of motion. Patients PROM is 20 degrees per protocol. Patient has F/U with MD next week. Goals are progressing at this time.    Personal Factors and Comorbidities  Age;Comorbidity 1    Comorbidities  left RTC repair 12/10/2019    Examination-Activity Limitations  Bathing;Reach  Overhead;Carry;Dressing;Hygiene/Grooming;Lift    Stability/Clinical Decision Making  Stable/Uncomplicated    Rehab Potential  Excellent    PT Frequency  3x / week    PT Duration  6 weeks    PT Treatment/Interventions  ADLs/Self Care Home Management;Cryotherapy;Electrical Stimulation;Moist Heat;Ultrasound;Neuromuscular re-education;Therapeutic activities;Therapeutic exercise;Functional mobility training;Patient/family education;Manual techniques;Vasopneumatic Device;Taping;Passive range of motion    PT Next Visit Plan  cont with PROM to left shoulder, NO ER >20 DEGREES FOR 6 WEEKS PER PROTOCOL, modalities PRN for pain relief (12/10/19 surgury/ 01/07/20 4 weeks) MD. Jones Broom 01/15/20 F/U note sent today    Consulted and Agree with Plan of Care  Patient       Patient will benefit from skilled therapeutic intervention in order to improve the following deficits and impairments:  Decreased activity tolerance, Decreased strength, Decreased range of motion, Pain, Impaired UE functional use, Postural dysfunction  Visit Diagnosis: Stiffness of left shoulder, not elsewhere classified  Muscle weakness (generalized)     Problem List There are no problems to display for this patient.  Cathie Hoops, PTA 01/09/20 8:20 AM  [facility] [address] [place], [place], [address] Phone: [phone]   Fax:  [phone]  Name: Alvin Sanchez MRN: [id number] Date of Birth: [birth year]

## 2020-01-16 ENCOUNTER — Encounter: Payer: Self-pay | Admitting: Physical Therapy

## 2020-01-16 ENCOUNTER — Ambulatory Visit: Payer: Worker's Compensation | Admitting: Physical Therapy

## 2020-01-16 ENCOUNTER — Other Ambulatory Visit: Payer: Self-pay

## 2020-01-16 DIAGNOSIS — M25612 Stiffness of left shoulder, not elsewhere classified: Secondary | ICD-10-CM

## 2020-01-16 DIAGNOSIS — M6281 Muscle weakness (generalized): Secondary | ICD-10-CM

## 2020-01-16 NOTE — Therapy (Signed)
[facility] [address] [place], [place], [address] Phone: [phone]   Fax:  [phone]  Physical Therapy Treatment  Patient Details  Name: Alvin Sanchez MRN: [id number] Date of Birth: Feb 04, 1956 Referring Provider (PT): Jones Broom, MD   Encounter Date: 01/16/2020  PT End of Session - 01/16/20 0745    Visit Number  7    Number of Visits  18    Date for PT Re-Evaluation  01/29/20    Authorization - Visit Number  6    Authorization - Number of Visits  18    PT Start Time  0734    PT Stop Time  0810    PT Time Calculation (min)  36 min    Activity Tolerance  Patient tolerated treatment well    Behavior During Therapy  [facility] for tasks assessed/performed       Past Medical History:  Diagnosis Date  . Anxiety   . Kidney stones     Past Surgical History:  Procedure Laterality Date  . FRACTURE SURGERY     toe  . ROTATOR CUFF REPAIR Left 12/10/2019  . THYROIDECTOMY Right 02/04/2011   Dr. Sandi Carne, [facility]    There were no vitals filed for this visit.  Subjective Assessment - 01/16/20 0743    Subjective  COVID-19 screening performed upon arrival. Patient arrives with no pain today and no new complaints/went to MD and is doing well, he DC the sling and to cont therapy with new order    Pertinent History  left RTC repair 12/10/2019    Limitations  Lifting;House hold activities    Patient Stated Goals  use arm normally again.    Currently in Pain?  No/denies                       [facility] Adult PT Treatment/Exercise - 01/16/20 0001      Electrical Stimulation   Electrical Stimulation Location  left shoulder    Electrical Stimulation Action  premod    Electrical Stimulation Parameters  80-150hz  x57min    Electrical Stimulation Goals  Edema      Vasopneumatic   Number Minutes Vasopneumatic   10 minutes    Vasopnuematic Location   Shoulder    Vasopneumatic Pressure  Low    Vasopneumatic  Temperature   34 for edema      Manual Therapy   Manual Therapy  Passive ROM    Passive ROM  left shoulder PROM into flexion and ER into 20 degrees per protocol. intermittent oscillations for muscle relaxation                  PT Long Term Goals - 01/16/20 0759      PT LONG TERM GOAL #1   Title  Patient will be independent with HEP    Time  6    Period  Weeks    Status  On-going      PT LONG TERM GOAL #2   Title  Patient will demonstrate 140+ degrees of left shoulder flexion AROM to improve ability to perform overhead tasks.    Time  6    Period  Weeks    Status  On-going      PT LONG TERM GOAL #3   Title  Patient will demonstrate 55+ degrees of left shoulder ER AROM to improve donning and doffing apparel.    Time  6    Period  Weeks    Status  On-going   limited to 20 degrees per protocol 01/16/20     PT LONG TERM GOAL #4   Title  Patient will demonstrate 4/5 or greater left shoulder MMT in all planes to improve stability during functional tasks.    Time  6    Period  Weeks    Status  On-going      PT LONG TERM GOAL #5   Title  Patient will be independent with ADLs and with pain less than or equal to 2/10 in left shoulder.    Time  6    Period  Weeks    Status  On-going            Plan - 01/16/20 0800    Clinical Impression Statement  Patient arrived with no pain and only felt a slight tightness with flexion. Patient went to MD and got a good report from MD to continue therapy, DC of sling and cont with precautions. Today patients arm continues to move with smooth arch of ROM and 20 degress with ER. Patient goals are progressing.    Personal Factors and Comorbidities  Age;Comorbidity 1    Comorbidities  left RTC repair 12/10/2019    Examination-Activity Limitations  Bathing;Reach Overhead;Carry;Dressing;Hygiene/Grooming;Lift    Stability/Clinical Decision Making  Stable/Uncomplicated    Rehab Potential  Excellent    PT Frequency  3x / week    PT  Duration  6 weeks    PT Treatment/Interventions  ADLs/Self Care Home Management;Cryotherapy;Electrical Stimulation;Moist Heat;Ultrasound;Neuromuscular re-education;Therapeutic activities;Therapeutic exercise;Functional mobility training;Patient/family education;Manual techniques;Vasopneumatic Device;Taping;Passive range of motion    PT Next Visit Plan  cont with PROM to left shoulder AAROM next week, NO ER >20 DEGREES FOR 6 WEEKS PER PROTOCOL, modalities PRN for pain relief (12/10/19 surgury/ 01/14/20 5 weeks)    Consulted and Agree with Plan of Care  Patient       Patient will benefit from skilled therapeutic intervention in order to improve the following deficits and impairments:  Decreased activity tolerance, Decreased strength, Decreased range of motion, Pain, Impaired UE functional use, Postural dysfunction  Visit Diagnosis: Stiffness of left shoulder, not elsewhere classified  Muscle weakness (generalized)     Problem List There are no problems to display for this patient.   Phillips Climes, PTA 01/16/2020, 8:11 AM  [facility] [place], [place], [address] Phone: [phone]   Fax:  [phone]  Name: Keahi Mccarney MRN: [id number] Date of Birth: Apr 22, 1956

## 2020-01-21 ENCOUNTER — Ambulatory Visit: Payer: Worker's Compensation | Admitting: Physical Therapy

## 2020-01-21 ENCOUNTER — Encounter: Payer: Self-pay | Admitting: Physical Therapy

## 2020-01-21 ENCOUNTER — Other Ambulatory Visit: Payer: Self-pay

## 2020-01-21 DIAGNOSIS — M25612 Stiffness of left shoulder, not elsewhere classified: Secondary | ICD-10-CM

## 2020-01-21 DIAGNOSIS — M6281 Muscle weakness (generalized): Secondary | ICD-10-CM

## 2020-01-21 NOTE — Therapy (Signed)
[facility] [place], [place], [address] Phone: [phone]   Fax:  [phone]  Physical Therapy Treatment  Patient Details  Name: Alvin Sanchez MRN: [id number] Date of Birth: 04/02/1956 Referring Provider (PT): Tania Ade, MD   Encounter Date: 01/21/2020  PT End of Session - 01/21/20 1024    Visit Number  8    Number of Visits  18    Date for PT Re-Evaluation  01/29/20    Authorization Type  Worker's compensation    Authorization - Visit Number  8    Authorization - Number of Visits  18    PT Start Time  [id number]    Activity Tolerance  Patient tolerated treatment well    Behavior During Therapy  [facility] for tasks assessed/performed       Past Medical History:  Diagnosis Date  . Anxiety   . Kidney stones     Past Surgical History:  Procedure Laterality Date  . FRACTURE SURGERY     toe  . ROTATOR CUFF REPAIR Left 12/10/2019  . THYROIDECTOMY Right 02/04/2011   Dr. Hardie Lora, [facility]    There were no vitals filed for this visit.  Subjective Assessment - 01/21/20 0742    Subjective  COVID-19 screening performed upon arrival. Patient reports no pain in the left shoulder this morning.    Pertinent History  left RTC repair 12/10/2019    Limitations  Lifting;House hold activities    Patient Stated Goals  use arm normally again.    Currently in Pain?  No/denies         [facility] PT Assessment - 01/21/20 0001      Assessment   Medical Diagnosis  Traumatic rotator cuff tear left; shoulder impingement, left    Referring Provider (PT)  Tania Ade, MD    Onset Date/Surgical Date  12/10/19    Hand Dominance  Right    Next MD Visit  02/26/2020    Prior Therapy  no      Precautions   Precautions  Shoulder    Precaution Comments  Follow RTC protocol see media                   [facility] Adult PT Treatment/Exercise - 01/21/20 0001      Shoulder Exercises: Supine   Protraction  AAROM;Both;20 reps     External Rotation  AAROM;20 reps;Left    Flexion  AAROM;Both;20 reps    Flexion Limitations  elbow bent      Electrical Stimulation   Electrical Stimulation Location  left shoulder    Electrical Stimulation Action  pre-mod    Electrical Stimulation Parameters  8-150 hz x15 mins    Electrical Stimulation Goals  Edema      Vasopneumatic   Number Minutes Vasopneumatic   10 minutes    Vasopnuematic Location   Shoulder    Vasopneumatic Pressure  Low    Vasopneumatic Temperature   34 for edema      Manual Therapy   Manual Therapy  Passive ROM;Joint mobilization    Joint Mobilization  II-III AP and inferior joint mobilizations to improve mobility    Passive ROM  left shoulder PROM into flexion and ER into 20 degrees per protocol. intermittent oscillations for muscle relaxation                  PT Long Term Goals - 01/16/20 0759      PT LONG TERM GOAL #1   Title  Patient  will be independent with HEP    Time  6    Period  Weeks    Status  On-going      PT LONG TERM GOAL #2   Title  Patient will demonstrate 140+ degrees of left shoulder flexion AROM to improve ability to perform overhead tasks.    Time  6    Period  Weeks    Status  On-going      PT LONG TERM GOAL #3   Title  Patient will demonstrate 55+ degrees of left shoulder ER AROM to improve donning and doffing apparel.    Time  6    Period  Weeks    Status  On-going   limited to 20 degrees per protocol 01/16/20     PT LONG TERM GOAL #4   Title  Patient will demonstrate 4/5 or greater left shoulder MMT in all planes to improve stability during functional tasks.    Time  6    Period  Weeks    Status  On-going      PT LONG TERM GOAL #5   Title  Patient will be independent with ADLs and with pain less than or equal to 2/10 in left shoulder.    Time  6    Period  Weeks    Status  On-going            Plan - 01/21/20 1610    Clinical Impression Statement  Patient responded well to therapy with the  addition of supine AAROM TEs. Patient is 6 weeks today and was able to achieve PROM ER > 20 degrees. Patient reported clicking with PROM flexion but reports no pain or discomfort with it. No other adverse affects otherwise    Personal Factors and Comorbidities  Age;Comorbidity 1    Comorbidities  left RTC repair 12/10/2019    Examination-Activity Limitations  Bathing;Reach Overhead;Carry;Dressing;Hygiene/Grooming;Lift    Stability/Clinical Decision Making  Stable/Uncomplicated    Clinical Decision Making  Low    Rehab Potential  Excellent    PT Frequency  3x / week    PT Duration  6 weeks    PT Treatment/Interventions  ADLs/Self Care Home Management;Cryotherapy;Electrical Stimulation;Moist Heat;Ultrasound;Neuromuscular re-education;Therapeutic activities;Therapeutic exercise;Functional mobility training;Patient/family education;Manual techniques;Vasopneumatic Device;Taping;Passive range of motion    PT Next Visit Plan  cont with PROM to left shoulder AAROM, see media modalities PRN for pain relief  (6 weeks 01/21/2020)    PT Home Exercise Plan  see patient education section    Consulted and Agree with Plan of Care  Patient       Patient will benefit from skilled therapeutic intervention in order to improve the following deficits and impairments:  Decreased activity tolerance, Decreased strength, Decreased range of motion, Pain, Impaired UE functional use, Postural dysfunction  Visit Diagnosis: Stiffness of left shoulder, not elsewhere classified  Muscle weakness (generalized)     Problem List There are no problems to display for this patient.   Guss Bunde, PT, DPT 01/21/2020, 10:25 AM  [facility] [address] [place], [place], [address] Phone: [phone]   Fax:  [phone]  Name: Connelly [place] MRN: [id number] Date of Birth: 1956/07/17

## 2020-01-23 ENCOUNTER — Encounter: Payer: 59 | Admitting: Physical Therapy

## 2020-01-28 ENCOUNTER — Other Ambulatory Visit: Payer: Self-pay

## 2020-01-28 ENCOUNTER — Ambulatory Visit: Payer: Worker's Compensation | Admitting: Physical Therapy

## 2020-01-28 ENCOUNTER — Encounter: Payer: Self-pay | Admitting: Physical Therapy

## 2020-01-28 DIAGNOSIS — M25612 Stiffness of left shoulder, not elsewhere classified: Secondary | ICD-10-CM | POA: Diagnosis not present

## 2020-01-28 DIAGNOSIS — M6281 Muscle weakness (generalized): Secondary | ICD-10-CM

## 2020-01-28 NOTE — Therapy (Signed)
[name], [place], [address] Phone: [phone]   Fax:  [phone]  Physical Therapy Treatment  Patient Details  Name: Alvin Sanchez MRN: [id number] Date of Birth: 1956/10/02 Referring Provider (PT): Tania Ade, MD   Encounter Date: 01/28/2020  PT End of Session - 01/28/20 0816    Visit Number  9    Number of Visits  18    Date for PT Re-Evaluation  01/29/20    Authorization Type  Worker's compensation    Authorization - Visit Number  9    Authorization - Number of Visits  18    PT Start Time  0730    PT Stop Time  0826    PT Time Calculation (min)  56 min    Activity Tolerance  Patient tolerated treatment well    Behavior During Therapy  [facility] for tasks assessed/performed       Past Medical History:  Diagnosis Date  . Anxiety   . Kidney stones     Past Surgical History:  Procedure Laterality Date  . FRACTURE SURGERY     toe  . ROTATOR CUFF REPAIR Left 12/10/2019  . THYROIDECTOMY Right 02/04/2011   Dr. Hardie Lora, [facility]    There were no vitals filed for this visit.  Subjective Assessment - 01/28/20 0816    Subjective  COVID-19 screening performed upon arrival. Patient reports no new complaints, feeling about the same.    Pertinent History  left RTC repair 12/10/2019    Limitations  Lifting;House hold activities    Patient Stated Goals  use arm normally again.    Currently in Pain?  No/denies         [facility] PT Assessment - 01/28/20 0001      Assessment   Medical Diagnosis  Traumatic rotator cuff tear left; shoulder impingement, left    Referring Provider (PT)  Tania Ade, MD    Onset Date/Surgical Date  12/10/19    Hand Dominance  Right    Next MD Visit  02/26/2020    Prior Therapy  no      Precautions   Precautions  Shoulder    Precaution Comments  Follow RTC protocol see media                   [facility] Adult PT Treatment/Exercise - 01/28/20 0001       Shoulder Exercises: Pulleys   Flexion  3 minutes      Shoulder Exercises: ROM/Strengthening   UBE (Upper Arm Bike)  AAROM 120 RPM x6 mins    Ranger  seated ranger flexion, CW, CCW x3 mins each      Electrical Stimulation   Electrical Stimulation Location  left shoulder    Electrical Stimulation Action  pre-mod    Electrical Stimulation Parameters  80-150 hz x15 mins    Electrical Stimulation Goals  Edema      Vasopneumatic   Number Minutes Vasopneumatic   15 minutes    Vasopnuematic Location   Shoulder    Vasopneumatic Pressure  Low    Vasopneumatic Temperature   34      Manual Therapy   Manual Therapy  Passive ROM;Joint mobilization    Joint Mobilization  II-III AP and inferior joint mobilizations to improve mobility    Passive ROM  left shoulder PROM into flexion, abd, IR and ER intermittent oscillations for muscle relaxation  PT Long Term Goals - 01/16/20 0759      PT LONG TERM GOAL #1   Title  Patient will be independent with HEP    Time  6    Period  Weeks    Status  On-going      PT LONG TERM GOAL #2   Title  Patient will demonstrate 140+ degrees of left shoulder flexion AROM to improve ability to perform overhead tasks.    Time  6    Period  Weeks    Status  On-going      PT LONG TERM GOAL #3   Title  Patient will demonstrate 55+ degrees of left shoulder ER AROM to improve donning and doffing apparel.    Time  6    Period  Weeks    Status  On-going   limited to 20 degrees per protocol 01/16/20     PT LONG TERM GOAL #4   Title  Patient will demonstrate 4/5 or greater left shoulder MMT in all planes to improve stability during functional tasks.    Time  6    Period  Weeks    Status  On-going      PT LONG TERM GOAL #5   Title  Patient will be independent with ADLs and with pain less than or equal to 2/10 in left shoulder.    Time  6    Period  Weeks    Status  On-going            Plan - 01/28/20 0818    Clinical  Impression Statement  Patient responded well to the progression of new AAROM TEs with mild discomfort towards end range flexion. Patient and PT discussed the progression of protocol and timeframe of when strengthening can begin. One audible click/pop was heard during PROM but no complaints of pain/disomfort. No adverse affects upon removal of modalities.    Personal Factors and Comorbidities  Age;Comorbidity 1    Comorbidities  left RTC repair 12/10/2019    Examination-Activity Limitations  Bathing;Reach Overhead;Carry;Dressing;Hygiene/Grooming;Lift    Stability/Clinical Decision Making  Stable/Uncomplicated    Clinical Decision Making  Low    Rehab Potential  Excellent    PT Frequency  3x / week    PT Duration  6 weeks    PT Treatment/Interventions  ADLs/Self Care Home Management;Cryotherapy;Electrical Stimulation;Moist Heat;Ultrasound;Neuromuscular re-education;Therapeutic activities;Therapeutic exercise;Functional mobility training;Patient/family education;Manual techniques;Vasopneumatic Device;Taping;Passive range of motion    PT Next Visit Plan  Progress note next visit; cont with PROM to left shoulder AAROM, see media modalities PRN for pain relief  (7 weeks 01/28/2020)    PT Home Exercise Plan  see patient education section    Consulted and Agree with Plan of Care  Patient       Patient will benefit from skilled therapeutic intervention in order to improve the following deficits and impairments:  Decreased activity tolerance, Decreased strength, Decreased range of motion, Pain, Impaired UE functional use, Postural dysfunction  Visit Diagnosis: Stiffness of left shoulder, not elsewhere classified  Muscle weakness (generalized)     Problem List There are no problems to display for this patient.   Alvin Sanchez, PT, DPT 01/28/2020, 8:40 AM  [facility] [address] [place], [place], [address] Phone: [phone]   Fax:   [phone]  Name: Alvin Sanchez MRN: [id number] Date of Birth: [birth year]

## 2020-01-30 ENCOUNTER — Encounter: Payer: Self-pay | Admitting: Physical Therapy

## 2020-01-30 ENCOUNTER — Other Ambulatory Visit: Payer: Self-pay

## 2020-01-30 ENCOUNTER — Ambulatory Visit: Payer: Worker's Compensation | Admitting: Physical Therapy

## 2020-01-30 DIAGNOSIS — M25612 Stiffness of left shoulder, not elsewhere classified: Secondary | ICD-10-CM | POA: Diagnosis not present

## 2020-01-30 DIAGNOSIS — M6281 Muscle weakness (generalized): Secondary | ICD-10-CM

## 2020-01-30 NOTE — Therapy (Signed)
[facility] [address] [place], [place], [address] Phone: [phone]   Fax:  [phone]  Physical Therapy Treatment  Progress Note Reporting Period 12/25/2019 to 01/30/2020  See note below for Objective Data and Assessment of Progress/Goals. Patient's goals are ongoing at this time. Patient progressed to [facility] per protocol.      Patient Details  Name: Alvin Sanchez MRN: [id number] Date of Birth: 11/15/1956 Referring Provider (PT): Jones Broom, MD   Encounter Date: 01/30/2020  PT End of Session - 01/30/20 0817    Visit Number  10    Number of Visits  18    Date for PT Re-Evaluation  01/29/20    Authorization Type  Worker's compensation    Authorization - Visit Number  10    Authorization - Number of Visits  18    PT Start Time  [phone]    PT Stop Time  0825    PT Time Calculation (min)  52 min    Activity Tolerance  Patient tolerated treatment well    Behavior During Therapy  [facility] for tasks assessed/performed       Past Medical History:  Diagnosis Date  . Anxiety   . Kidney stones     Past Surgical History:  Procedure Laterality Date  . FRACTURE SURGERY     toe  . ROTATOR CUFF REPAIR Left 12/10/2019  . THYROIDECTOMY Right 02/04/2011   Dr. Sandi Carne, [facility]    There were no vitals filed for this visit.  Subjective Assessment - 01/30/20 0744    Subjective  COVID-19 screening performed upon arrival. Patient reports more soreness today in left shoulder since initiating more AAROM TEs    Pertinent History  left RTC repair 12/10/2019    Limitations  Lifting;House hold activities    Patient Stated Goals  use arm normally again.    Currently in Pain?  Yes    Pain Score  2     Pain Location  Shoulder    Pain Orientation  Left    Pain Descriptors / Indicators  Sore    Pain Type  Surgical pain    Pain Onset  More than a month ago    Pain Frequency  Occasional         [facility] PT Assessment - 01/30/20 0001       Assessment   Medical Diagnosis  Traumatic rotator cuff tear left; shoulder impingement, left    Referring Provider (PT)  Jones Broom, MD    Onset Date/Surgical Date  12/10/19    Hand Dominance  Right    Next MD Visit  02/26/2020    Prior Therapy  no      Precautions   Precautions  Shoulder    Precaution Comments  Follow RTC protocol see media                   [facility] Adult PT Treatment/Exercise - 01/30/20 0001      Shoulder Exercises: Supine   Protraction  AAROM;Both;20 reps    External Rotation  AAROM;20 reps;Left    Flexion  AAROM;Both;20 reps      Shoulder Exercises: Pulleys   Flexion  5 minutes      Shoulder Exercises: ROM/Strengthening   Ranger  seated ranger flexion, CW, CCW x3 mins each      Electrical Stimulation   Electrical Stimulation Location  left shoulder    Electrical Stimulation Action  pre-mod    Electrical Stimulation Parameters  80-150 hz x15 mins  Electrical Stimulation Goals  Pain;Edema      Vasopneumatic   Number Minutes Vasopneumatic   15 minutes    Vasopnuematic Location   Shoulder    Vasopneumatic Pressure  Low    Vasopneumatic Temperature   34      Manual Therapy   Manual Therapy  Passive ROM;Joint mobilization    Joint Mobilization  II-III AP and inferior joint mobilizations to improve mobility    Passive ROM  left shoulder PROM into flexion, abd, IR and ER intermittent oscillations for muscle relaxation                  PT Long Term Goals - 01/30/20 0824      PT LONG TERM GOAL #1   Title  Patient will be independent with HEP    Time  6    Period  Weeks    Status  On-going   new established HEP 01/30/2020     PT LONG TERM GOAL #2   Title  Patient will demonstrate 140+ degrees of left shoulder flexion AROM to improve ability to perform overhead tasks.    Time  6    Period  Weeks    Status  On-going      PT LONG TERM GOAL #3   Title  Patient will demonstrate 55+ degrees of left shoulder ER AROM to  improve donning and doffing apparel.    Time  6    Period  Weeks    Status  On-going      PT LONG TERM GOAL #4   Title  Patient will demonstrate 4/5 or greater left shoulder MMT in all planes to improve stability during functional tasks.    Time  6    Period  Weeks      PT LONG TERM GOAL #5   Title  Patient will be independent with ADLs and with pain less than or equal to 2/10 in left shoulder.    Time  6    Period  Weeks    Status  On-going            Plan - 01/30/20 8841    Clinical Impression Statement  Patient was able to tolerate treatment well but with soreness and fatigue with AAROM TEs. Patient instructed to take rest breaks as necessary. Patient notedwith two audible pops and a catching/discomfort sensation with PROM into flexion and ABD. Discomfort dissipated after oscilations. No adverse affects upon removal of modalites.    Personal Factors and Comorbidities  Age;Comorbidity 1    Comorbidities  left RTC repair 12/10/2019    Examination-Activity Limitations  Bathing;Reach Overhead;Carry;Dressing;Hygiene/Grooming;Lift    Stability/Clinical Decision Making  Stable/Uncomplicated    Clinical Decision Making  Low    Rehab Potential  Excellent    PT Frequency  3x / week    PT Duration  6 weeks    PT Treatment/Interventions  ADLs/Self Care Home Management;Cryotherapy;Electrical Stimulation;Moist Heat;Ultrasound;Neuromuscular re-education;Therapeutic activities;Therapeutic exercise;Functional mobility training;Patient/family education;Manual techniques;Vasopneumatic Device;Taping;Passive range of motion    PT Next Visit Plan  cont with PROM to left shoulder AAROM, see media modalities PRN for pain relief  (7 weeks 01/28/2020)    Consulted and Agree with Plan of Care  Patient       Patient will benefit from skilled therapeutic intervention in order to improve the following deficits and impairments:  Decreased activity tolerance, Decreased strength, Decreased range of motion,  Pain, Impaired UE functional use, Postural dysfunction  Visit Diagnosis: Stiffness of left shoulder, not elsewhere  classified  Muscle weakness (generalized)     Problem List There are no problems to display for this patient.   [name], PT, DPT 01/30/2020, 8:36 AM  [facility] [address] [place], [place], [address] Phone: [phone]   Fax:  [phone]  Name: Asir Bingley MRN: [id number] Date of Birth: 14-Feb-1956

## 2020-02-04 ENCOUNTER — Other Ambulatory Visit: Payer: Self-pay

## 2020-02-04 ENCOUNTER — Ambulatory Visit: Payer: Worker's Compensation | Attending: Orthopedic Surgery | Admitting: Physical Therapy

## 2020-02-04 DIAGNOSIS — M6281 Muscle weakness (generalized): Secondary | ICD-10-CM | POA: Insufficient documentation

## 2020-02-04 DIAGNOSIS — M25612 Stiffness of left shoulder, not elsewhere classified: Secondary | ICD-10-CM | POA: Insufficient documentation

## 2020-02-04 NOTE — Therapy (Signed)
[facility] [place], [place], [address] Phone: [phone]   Fax:  [phone]  Physical Therapy Treatment  Patient Details  Name: Alvin Sanchez MRN: [id number] Date of Birth: 03/07/1956 Referring Provider (PT): Tania Ade, MD   Encounter Date: 02/04/2020  PT End of Session - 02/04/20 0756    Visit Number  11    Number of Visits  18    Authorization Type  Worker's compensation    Authorization - Visit Number  11    Authorization - Number of Visits  18    PT Start Time  2458    PT Stop Time  0822    PT Time Calculation (min)  51 min    Activity Tolerance  Patient tolerated treatment well    Behavior During Therapy  [facility] for tasks assessed/performed       Past Medical History:  Diagnosis Date  . Anxiety   . Kidney stones     Past Surgical History:  Procedure Laterality Date  . FRACTURE SURGERY     toe  . ROTATOR CUFF REPAIR Left 12/10/2019  . THYROIDECTOMY Right 02/04/2011   Dr. Hardie Lora, [facility]    There were no vitals filed for this visit.  Subjective Assessment - 02/04/20 0755    Subjective  COVID-19 screening performed upon arrival. Patient reports feeling ongoing sorenesss and reports being compliant with HEP    Pertinent History  left RTC repair 12/10/2019    Limitations  Lifting;House hold activities    Patient Stated Goals  use arm normally again.    Currently in Pain?  Yes   did not provide number on pain scale        [facility] PT Assessment - 02/04/20 0001      Assessment   Medical Diagnosis  Traumatic rotator cuff tear left; shoulder impingement, left    Referring Provider (PT)  Tania Ade, MD    Onset Date/Surgical Date  12/10/19    Hand Dominance  Right    Next MD Visit  02/26/2020    Prior Therapy  no      Precautions   Precautions  Shoulder    Precaution Comments  Follow RTC protocol see media                   [facility] Adult PT Treatment/Exercise -  02/04/20 0001      Shoulder Exercises: Supine   Protraction  AAROM;Both;20 reps    External Rotation  AAROM;20 reps;Left    Flexion  AAROM;Both;20 reps      Shoulder Exercises: Pulleys   Flexion  5 minutes      Shoulder Exercises: ROM/Strengthening   Ranger  standing ranger flexion, CW, CCW x2 mins each      Shoulder Exercises: Stretch   Corner Stretch  3 reps;20 seconds      Electrical Stimulation   Electrical Stimulation Location  left shoulder    Electrical Stimulation Action  pre-mod    Electrical Stimulation Parameters  80-150 hz x15 mins    Electrical Stimulation Goals  Pain;Edema      Vasopneumatic   Number Minutes Vasopneumatic   15 minutes    Vasopnuematic Location   Shoulder    Vasopneumatic Pressure  Low    Vasopneumatic Temperature   34      Manual Therapy   Manual Therapy  Passive ROM;Joint mobilization    Joint Mobilization  II-III AP and inferior joint mobilizations to improve mobility    Passive  ROM  left shoulder PROM into flexion, abd, IR and ER intermittent oscillations for muscle relaxation                  PT Long Term Goals - 01/30/20 0824      PT LONG TERM GOAL #1   Title  Patient will be independent with HEP    Time  6    Period  Weeks    Status  On-going   new established HEP 01/30/2020     PT LONG TERM GOAL #2   Title  Patient will demonstrate 140+ degrees of left shoulder flexion AROM to improve ability to perform overhead tasks.    Time  6    Period  Weeks    Status  On-going      PT LONG TERM GOAL #3   Title  Patient will demonstrate 55+ degrees of left shoulder ER AROM to improve donning and doffing apparel.    Time  6    Period  Weeks    Status  On-going      PT LONG TERM GOAL #4   Title  Patient will demonstrate 4/5 or greater left shoulder MMT in all planes to improve stability during functional tasks.    Time  6    Period  Weeks      PT LONG TERM GOAL #5   Title  Patient will be independent with ADLs and with  pain less than or equal to 2/10 in left shoulder.    Time  6    Period  Weeks    Status  On-going            Plan - 02/04/20 0740    Clinical Impression Statement  Patient was able to complete session but with reports of "grinding and catching" feeling with standing flexion AAROM with ranger. Patient reported feeling uncomfortable with sensation; minimal relief when adjusted ROM. Patient noted with decreased PA joint mobs of left shoulder in comparison to inferior joint mobs. No adverse affects upon removal of modalities.    Personal Factors and Comorbidities  Age;Comorbidity 1    Comorbidities  left RTC repair 12/10/2019    Examination-Activity Limitations  Bathing;Reach Overhead;Carry;Dressing;Hygiene/Grooming;Lift    Stability/Clinical Decision Making  Stable/Uncomplicated    Clinical Decision Making  Low    Rehab Potential  Excellent    PT Frequency  3x / week    PT Duration  6 weeks    PT Treatment/Interventions  ADLs/Self Care Home Management;Cryotherapy;Electrical Stimulation;Moist Heat;Ultrasound;Neuromuscular re-education;Therapeutic activities;Therapeutic exercise;Functional mobility training;Patient/family education;Manual techniques;Vasopneumatic Device;Taping;Passive range of motion    PT Next Visit Plan  cont with PROM to left shoulder AAROM, see media modalities PRN for pain relief  (8 weeks 02/05/2020)    PT Home Exercise Plan  see patient education section    Consulted and Agree with Plan of Care  Patient       Patient will benefit from skilled therapeutic intervention in order to improve the following deficits and impairments:  Decreased activity tolerance, Decreased strength, Decreased range of motion, Pain, Impaired UE functional use, Postural dysfunction  Visit Diagnosis: Stiffness of left shoulder, not elsewhere classified  Muscle weakness (generalized)     Problem List There are no problems to display for this patient.   Guss Bunde, PT,  DPT 02/04/2020, 8:39 AM  [facility] [address] [place], [place], [address] Phone: [phone]   Fax:  [phone]  Name: Alvin Sanchez MRN: [id number] Date of Birth: 1956/06/03

## 2020-02-06 ENCOUNTER — Ambulatory Visit: Payer: Worker's Compensation | Admitting: Physical Therapy

## 2020-02-06 ENCOUNTER — Encounter: Payer: Self-pay | Admitting: Physical Therapy

## 2020-02-06 ENCOUNTER — Other Ambulatory Visit: Payer: Self-pay

## 2020-02-06 DIAGNOSIS — M25612 Stiffness of left shoulder, not elsewhere classified: Secondary | ICD-10-CM

## 2020-02-06 DIAGNOSIS — M6281 Muscle weakness (generalized): Secondary | ICD-10-CM

## 2020-02-06 NOTE — Therapy (Signed)
[facility] [place], [place], [address] Phone: [phone]   Fax:  [phone]  Physical Therapy Treatment  Patient Details  Name: Alvin Sanchez MRN: [id number] Date of Birth: 05-22-1956 Referring Provider (PT): Tania Ade, MD   Encounter Date: 02/06/2020  PT End of Session - 02/06/20 0741    Visit Number  12    Number of Visits  18    Date for PT Re-Evaluation  02/26/20   per last refferal   Authorization Type  Worker's compensation    Authorization - Visit Number  11    Authorization - Number of Visits  18    PT Start Time  0731    PT Stop Time  0816    PT Time Calculation (min)  45 min    Activity Tolerance  Patient tolerated treatment well    Behavior During Therapy  [facility] for tasks assessed/performed       Past Medical History:  Diagnosis Date  . Anxiety   . Kidney stones     Past Surgical History:  Procedure Laterality Date  . FRACTURE SURGERY     toe  . ROTATOR CUFF REPAIR Left 12/10/2019  . THYROIDECTOMY Right 02/04/2011   Dr. Hardie Lora, [facility]    There were no vitals filed for this visit.  Subjective Assessment - 02/06/20 0733    Subjective  COVID-19 screening performed upon arrival. Patient reports feeling some ongoing sorenesss yet no pain    Pertinent History  left RTC repair 12/10/2019    Limitations  Lifting;House hold activities    Patient Stated Goals  use arm normally again.    Currently in Pain?  No/denies    Pain Location  Shoulder    Pain Orientation  Left    Pain Type  Surgical pain    Pain Onset  More than a month ago    Aggravating Factors   prolong movements    Pain Relieving Factors  at rest         [facility] PT Assessment - 02/06/20 0001      PROM   PROM Assessment Site  Shoulder    Right/Left Shoulder  Left    Left Shoulder Flexion  125 Degrees    Left Shoulder External Rotation  65 Degrees                   [facility] Adult PT Treatment/Exercise -  02/06/20 0001      Shoulder Exercises: Supine   Protraction  AAROM;Both;20 reps    External Rotation  AAROM;20 reps;Left    Flexion  AAROM;Both;20 reps    Flexion Limitations  elbow bent      Shoulder Exercises: Pulleys   Flexion  5 minutes      Shoulder Exercises: ROM/Strengthening   Ranger  standing ranger flexion, CW, CCW x2 mins each      Electrical Stimulation   Electrical Stimulation Location  left shoulder    Electrical Stimulation Action  premod    Electrical Stimulation Parameters  80-150hz  x59min    Electrical Stimulation Goals  Pain;Edema      Vasopneumatic   Number Minutes Vasopneumatic   15 minutes    Vasopnuematic Location   Shoulder    Vasopneumatic Pressure  Low    Vasopneumatic Temperature   34 for edema      Manual Therapy   Manual Therapy  Passive ROM    Passive ROM  left shoulder PROM into flexion, abd, IR and ER intermittent  oscillations for muscle relaxation                  PT Long Term Goals - 02/06/20 0743      PT LONG TERM GOAL #1   Title  Patient will be independent with HEP    Time  6    Period  Weeks    Status  On-going      PT LONG TERM GOAL #2   Title  Patient will demonstrate 140+ degrees of left shoulder flexion AROM to improve ability to perform overhead tasks.    Time  6    Period  Weeks    Status  On-going      PT LONG TERM GOAL #3   Title  Patient will demonstrate 55+ degrees of left shoulder ER AROM to improve donning and doffing apparel.    Time  6    Period  Weeks    Status  On-going      PT LONG TERM GOAL #4   Title  Patient will demonstrate 4/5 or greater left shoulder MMT in all planes to improve stability during functional tasks.    Time  6    Period  Weeks    Status  On-going      PT LONG TERM GOAL #5   Title  Patient will be independent with ADLs and with pain less than or equal to 2/10 in left shoulder.    Time  6    Period  Weeks    Status  On-going            Plan - 02/06/20 0801     Clinical Impression Statement  Patient tolerated treatment well today. Patient reported no pain only soreness with prolong movements overall. Patient was educated on current protocol and progression, healing and restrictions. Patient reported doing HEP as instructed by PT daily and has been using arm for light ADL's such as cup and other light activities. Patient has improved with ROM for all motions today. Patient has smooth arc with movement. Goals are progressing.    Personal Factors and Comorbidities  Age;Comorbidity 1    Comorbidities  left RTC repair 12/10/2019    Examination-Activity Limitations  Bathing;Reach Overhead;Carry;Dressing;Hygiene/Grooming;Lift    Stability/Clinical Decision Making  Stable/Uncomplicated    Rehab Potential  Excellent    PT Frequency  3x / week    PT Duration  6 weeks    PT Treatment/Interventions  ADLs/Self Care Home Management;Cryotherapy;Electrical Stimulation;Moist Heat;Ultrasound;Neuromuscular re-education;Therapeutic activities;Therapeutic exercise;Functional mobility training;Patient/family education;Manual techniques;Vasopneumatic Device;Taping;Passive range of motion    PT Next Visit Plan  cont with PROM to left shoulder AAROM, see media modalities PRN for pain relief  (8 weeks 02/05/2020)    Consulted and Agree with Plan of Care  Patient       Patient will benefit from skilled therapeutic intervention in order to improve the following deficits and impairments:  Decreased activity tolerance, Decreased strength, Decreased range of motion, Pain, Impaired UE functional use, Postural dysfunction  Visit Diagnosis: Stiffness of left shoulder, not elsewhere classified  Muscle weakness (generalized)     Problem List There are no problems to display for this patient.   Hermelinda Dellen, PTA 02/06/2020, 8:19 AM  [facility] [address] [place], [place], [address] Phone: [phone]   Fax:   [phone]  Name: Alvin Sanchez MRN: [id number] Date of Birth: [birth year]

## 2020-02-11 ENCOUNTER — Ambulatory Visit: Payer: Worker's Compensation | Admitting: Physical Therapy

## 2020-02-11 ENCOUNTER — Encounter: Payer: Self-pay | Admitting: Physical Therapy

## 2020-02-11 ENCOUNTER — Other Ambulatory Visit: Payer: Self-pay

## 2020-02-11 DIAGNOSIS — M6281 Muscle weakness (generalized): Secondary | ICD-10-CM

## 2020-02-11 DIAGNOSIS — M25612 Stiffness of left shoulder, not elsewhere classified: Secondary | ICD-10-CM | POA: Diagnosis not present

## 2020-02-11 NOTE — Therapy (Signed)
[facility] [place], [place], [address] Phone: [phone]   Fax:  [phone]  Physical Therapy Treatment  Patient Details  Name: Corinthian Mizrahi MRN: [id number] Date of Birth: [birth year] Referring Provider (PT): Tania Ade, MD   Encounter Date: 02/11/2020  PT End of Session - 02/11/20 0740    Visit Number  13    Number of Visits  18    Date for PT Re-Evaluation  02/26/20    Authorization Type  Worker's compensation    Authorization - Visit Number  13    Authorization - Number of Visits  18    PT Start Time  0731    PT Stop Time  0823    PT Time Calculation (min)  52 min    Activity Tolerance  Patient tolerated treatment well    Behavior During Therapy  [facility] for tasks assessed/performed       Past Medical History:  Diagnosis Date  . Anxiety   . Kidney stones     Past Surgical History:  Procedure Laterality Date  . FRACTURE SURGERY     toe  . ROTATOR CUFF REPAIR Left 12/10/2019  . THYROIDECTOMY Right 02/04/2011   Dr. Hardie Lora, [facility]    There were no vitals filed for this visit.      [facility] PT Assessment - 02/11/20 0001      Assessment   Medical Diagnosis  Traumatic rotator cuff tear left; shoulder impingement, left    Referring Provider (PT)  Tania Ade, MD    Onset Date/Surgical Date  12/10/19    Hand Dominance  Right    Next MD Visit  02/26/2020    Prior Therapy  no      Precautions   Precautions  Shoulder    Precaution Comments  Follow RTC protocol see media                   [facility] Adult PT Treatment/Exercise - 02/11/20 0001      Shoulder Exercises: Supine   Protraction  AROM;Left;10 reps    External Rotation  AROM;20 reps    Flexion  AROM;Left;10 reps      Shoulder Exercises: Pulleys   Flexion  5 minutes      Shoulder Exercises: ROM/Strengthening   Ranger  standing ranger flexion, CW, CCW x2 mins each    Other ROM/Strengthening Exercises  wall ladder x3  minutes level 35      Shoulder Exercises: Isometric Strengthening   Flexion  Other (comment)   5"x10   Extension  Other (comment)   5"x10   External Rotation  Other (comment)   5"x10   Internal Rotation  Other (comment)   5"x10     Electrical Stimulation   Electrical Stimulation Location  left shoulder    Electrical Stimulation Action  pre-mod    Electrical Stimulation Parameters  80-150 hz x10 min    Electrical Stimulation Goals  Pain;Edema      Vasopneumatic   Number Minutes Vasopneumatic   10 minutes    Vasopnuematic Location   Shoulder    Vasopneumatic Pressure  Low    Vasopneumatic Temperature   34 for edema             PT Education - 02/11/20 0750    Education Details  4 way isometrics, flexion, IR, extension, ER    Person(s) Educated  Patient    Methods  Explanation;Demonstration;Handout    Comprehension  Verbalized understanding;Returned demonstration  PT Long Term Goals - 02/06/20 0743      PT LONG TERM GOAL #1   Title  Patient will be independent with HEP    Time  6    Period  Weeks    Status  On-going      PT LONG TERM GOAL #2   Title  Patient will demonstrate 140+ degrees of left shoulder flexion AROM to improve ability to perform overhead tasks.    Time  6    Period  Weeks    Status  On-going      PT LONG TERM GOAL #3   Title  Patient will demonstrate 55+ degrees of left shoulder ER AROM to improve donning and doffing apparel.    Time  6    Period  Weeks    Status  On-going      PT LONG TERM GOAL #4   Title  Patient will demonstrate 4/5 or greater left shoulder MMT in all planes to improve stability during functional tasks.    Time  6    Period  Weeks    Status  On-going      PT LONG TERM GOAL #5   Title  Patient will be independent with ADLs and with pain less than or equal to 2/10 in left shoulder.    Time  6    Period  Weeks    Status  On-going            Plan - 02/11/20 0820    Clinical Impression Statement   Patient responded fairly well to exercise with the addition of shoulder isometrics. Patient still reports ongoing grinding feeling at roughly 100-110 degrees of shoulder flexion with pain/discomfort. Patient discussed shoulder anatomy for education. Patient provided with isometrics for HEP which patient reported understanding. No adverse affects upon removal.    Personal Factors and Comorbidities  Age;Comorbidity 1    Comorbidities  left RTC repair 12/10/2019    Examination-Activity Limitations  Bathing;Reach Overhead;Carry;Dressing;Hygiene/Grooming;Lift    Stability/Clinical Decision Making  Stable/Uncomplicated    Clinical Decision Making  Low    Rehab Potential  Excellent    PT Frequency  3x / week    PT Duration  6 weeks    PT Treatment/Interventions  ADLs/Self Care Home Management;Cryotherapy;Electrical Stimulation;Moist Heat;Ultrasound;Neuromuscular re-education;Therapeutic activities;Therapeutic exercise;Functional mobility training;Patient/family education;Manual techniques;Vasopneumatic Device;Taping;Passive range of motion    PT Next Visit Plan  cont with PROM to left shoulder AAROM, see media modalities PRN for pain relief  (10 weeks 02/19/2020)    PT Home Exercise Plan  see patient education section    Consulted and Agree with Plan of Care  Patient       Patient will benefit from skilled therapeutic intervention in order to improve the following deficits and impairments:  Decreased activity tolerance, Decreased strength, Decreased range of motion, Pain, Impaired UE functional use, Postural dysfunction  Visit Diagnosis: Stiffness of left shoulder, not elsewhere classified  Muscle weakness (generalized)     Problem List There are no problems to display for this patient.   Guss Bunde, PT, DPT 02/11/2020, 8:33 AM  [facility] [address] [place], [place], [address] Phone: [phone]   Fax:  [phone]  Name: Blanchard Willhite MRN: [id number] Date of Birth: Jul 16, 1956

## 2020-02-13 ENCOUNTER — Other Ambulatory Visit: Payer: Self-pay

## 2020-02-13 ENCOUNTER — Encounter: Payer: Self-pay | Admitting: Physical Therapy

## 2020-02-13 ENCOUNTER — Ambulatory Visit: Payer: Worker's Compensation | Admitting: Physical Therapy

## 2020-02-13 DIAGNOSIS — M25612 Stiffness of left shoulder, not elsewhere classified: Secondary | ICD-10-CM

## 2020-02-13 DIAGNOSIS — M6281 Muscle weakness (generalized): Secondary | ICD-10-CM

## 2020-02-13 NOTE — Therapy (Signed)
[facility] [place], [place], [address] Phone: [phone]   Fax:  [phone]  Physical Therapy Treatment  Patient Details  Name: Kashawn Dirr MRN: [id number] Date of Birth: 21-Mar-1956 Referring Provider (PT): Tania Ade, MD   Encounter Date: 02/13/2020  PT End of Session - 02/13/20 0810    Visit Number  14    Number of Visits  18    Date for PT Re-Evaluation  02/26/20    Authorization Type  Worker's compensation    Authorization - Visit Number  14    Authorization - Number of Visits  18    PT Start Time  0730    PT Stop Time  0821    PT Time Calculation (min)  51 min    Activity Tolerance  Patient tolerated treatment well    Behavior During Therapy  [facility] for tasks assessed/performed       Past Medical History:  Diagnosis Date  . Anxiety   . Kidney stones     Past Surgical History:  Procedure Laterality Date  . FRACTURE SURGERY     toe  . ROTATOR CUFF REPAIR Left 12/10/2019  . THYROIDECTOMY Right 02/04/2011   Dr. Hardie Lora, [facility]    There were no vitals filed for this visit.  Subjective Assessment - 02/13/20 0737    Subjective  COVID-19 screening performed upon arrival. Patient reports no pain at rest.    Pertinent History  left RTC repair 12/10/2019    Limitations  Lifting;House hold activities    Patient Stated Goals  use arm normally again.    Currently in Pain?  No/denies         [facility] PT Assessment - 02/13/20 0001      Assessment   Medical Diagnosis  Traumatic rotator cuff tear left; shoulder impingement, left    Referring Provider (PT)  Tania Ade, MD    Onset Date/Surgical Date  12/10/19    Hand Dominance  Right    Next MD Visit  02/26/2020    Prior Therapy  no      Precautions   Precautions  Shoulder    Precaution Comments  Follow RTC protocol see media                   [facility] Adult PT Treatment/Exercise - 02/13/20 0001      Shoulder Exercises:  Supine   Protraction  AROM;Left;10 reps    Flexion  AROM;Left;10 reps      Shoulder Exercises: Prone   Retraction  --    Extension  --      Shoulder Exercises: Sidelying   External Rotation  AROM;Left;10 reps      Shoulder Exercises: Standing   Flexion  AROM;20 reps    Flexion Limitations  cone from countertop to wedge      Shoulder Exercises: Pulleys   Flexion  5 minutes      Shoulder Exercises: ROM/Strengthening   Ranger  standing ranger flexion, CW, CCW x3 mins each      Electrical Stimulation   Electrical Stimulation Location  left shoulder    Electrical Stimulation Action  pre-mod    Electrical Stimulation Parameters  80-150 hz x15 mins    Electrical Stimulation Goals  Pain;Edema      Vasopneumatic   Number Minutes Vasopneumatic   15 minutes    Vasopnuematic Location   Shoulder    Vasopneumatic Pressure  Low    Vasopneumatic Temperature   34 for edema  PT Education - 02/13/20 0810    Education Details  wall slides and wall circles    Person(s) Educated  Patient    Methods  Explanation;Demonstration;Handout    Comprehension  Verbalized understanding;Returned demonstration          PT Long Term Goals - 02/06/20 0743      PT LONG TERM GOAL #1   Title  Patient will be independent with HEP    Time  6    Period  Weeks    Status  On-going      PT LONG TERM GOAL #2   Title  Patient will demonstrate 140+ degrees of left shoulder flexion AROM to improve ability to perform overhead tasks.    Time  6    Period  Weeks    Status  On-going      PT LONG TERM GOAL #3   Title  Patient will demonstrate 55+ degrees of left shoulder ER AROM to improve donning and doffing apparel.    Time  6    Period  Weeks    Status  On-going      PT LONG TERM GOAL #4   Title  Patient will demonstrate 4/5 or greater left shoulder MMT in all planes to improve stability during functional tasks.    Time  6    Period  Weeks    Status  On-going      PT LONG TERM  GOAL #5   Title  Patient will be independent with ADLs and with pain less than or equal to 2/10 in left shoulder.    Time  6    Period  Weeks    Status  On-going            Plan - 02/13/20 0810    Clinical Impression Statement  Patient responded well to the addition of new TEs but still with grinding sensation in anterior shoulder and lateral shoulder. Patient attempted left shoulder flexion into lower cabinet but unable without compensatory motions; flexion modified to black wedge. No adverse affects upon removal of modaliteis.    Personal Factors and Comorbidities  Age;Comorbidity 1    Comorbidities  left RTC repair 12/10/2019    Examination-Activity Limitations  Bathing;Reach Overhead;Carry;Dressing;Hygiene/Grooming;Lift    Stability/Clinical Decision Making  Stable/Uncomplicated    Clinical Decision Making  Low    Rehab Potential  Excellent    PT Frequency  3x / week    PT Duration  6 weeks    PT Treatment/Interventions  ADLs/Self Care Home Management;Cryotherapy;Electrical Stimulation;Moist Heat;Ultrasound;Neuromuscular re-education;Therapeutic activities;Therapeutic exercise;Functional mobility training;Patient/family education;Manual techniques;Vasopneumatic Device;Taping;Passive range of motion    PT Next Visit Plan  cont with PROM to left shoulder AAROM, see media modalities PRN for pain relief  (10 weeks 02/19/2020)    PT Home Exercise Plan  see patient education section    Consulted and Agree with Plan of Care  Patient       Patient will benefit from skilled therapeutic intervention in order to improve the following deficits and impairments:  Decreased activity tolerance, Decreased strength, Decreased range of motion, Pain, Impaired UE functional use, Postural dysfunction  Visit Diagnosis: Stiffness of left shoulder, not elsewhere classified  Muscle weakness (generalized)     Problem List There are no problems to display for this patient.   Guss Bunde, PT,  DPT 02/13/2020, 8:24 AM  [facility] [address] [place], [place], [address] Phone: [phone]   Fax:  [phone]  Name: Baruch Lewers MRN:  [id number] Date of Birth: February 12, 1956

## 2020-02-18 ENCOUNTER — Other Ambulatory Visit: Payer: Self-pay

## 2020-02-18 ENCOUNTER — Ambulatory Visit: Payer: Worker's Compensation | Admitting: Physical Therapy

## 2020-02-18 ENCOUNTER — Encounter: Payer: Self-pay | Admitting: Physical Therapy

## 2020-02-18 DIAGNOSIS — M25612 Stiffness of left shoulder, not elsewhere classified: Secondary | ICD-10-CM | POA: Diagnosis not present

## 2020-02-18 DIAGNOSIS — M6281 Muscle weakness (generalized): Secondary | ICD-10-CM

## 2020-02-18 NOTE — Therapy (Signed)
[facility] [place], [place], [address] Phone: [phone]   Fax:  [phone]  Physical Therapy Treatment  Patient Details  Name: Alvin Sanchez MRN: [id number] Date of Birth: 1955-12-12 Referring Provider (PT): Tania Ade, MD   Encounter Date: 02/18/2020  PT End of Session - 02/18/20 0739    Visit Number  15    Number of Visits  18    Date for PT Re-Evaluation  02/26/20    Authorization Type  Worker's compensation    Authorization - Visit Number  15    Authorization - Number of Visits  18    PT Start Time  [phone]    PT Stop Time  0822    PT Time Calculation (min)  49 min    Activity Tolerance  Patient tolerated treatment well    Behavior During Therapy  [facility] for tasks assessed/performed       Past Medical History:  Diagnosis Date  . Anxiety   . Kidney stones     Past Surgical History:  Procedure Laterality Date  . FRACTURE SURGERY     toe  . ROTATOR CUFF REPAIR Left 12/10/2019  . THYROIDECTOMY Right 02/04/2011   Dr. Hardie Lora, [facility]    There were no vitals filed for this visit.  Subjective Assessment - 02/18/20 0738    Subjective  COVID-19 screening performed upon arrival. Patient reported having pain with wall circles when arm was stretched out. Reports no pain just soreness in the the left shoulder.    Pertinent History  left RTC repair 12/10/2019    Limitations  Lifting;House hold activities    Patient Stated Goals  use arm normally again.    Currently in Pain?  No/denies         [facility] PT Assessment - 02/18/20 0001      Assessment   Medical Diagnosis  Traumatic rotator cuff tear left; shoulder impingement, left    Referring Provider (PT)  Tania Ade, MD    Onset Date/Surgical Date  12/10/19    Hand Dominance  Right    Next MD Visit  02/26/2020    Prior Therapy  no      Precautions   Precautions  Shoulder    Precaution Comments  Follow RTC protocol see media                    [facility] Adult PT Treatment/Exercise - 02/18/20 0001      Shoulder Exercises: Supine   Protraction  AROM;Left;10 reps    Flexion  AROM;Left;10 reps      Shoulder Exercises: Standing   Flexion  AROM;15 reps    Flexion Limitations  cone from countertop to 2 inch step    Row  Strengthening;Left;20 reps;Theraband    Theraband Level (Shoulder Row)  Level 1 (Yellow)    Other Standing Exercises  standing uppercuts 2x10      Shoulder Exercises: Pulleys   Flexion  5 minutes      Shoulder Exercises: ROM/Strengthening   Other ROM/Strengthening Exercises  wall ladder x3 minutes level 35      Electrical Stimulation   Electrical Stimulation Location  left shoulder    Electrical Stimulation Action  pre-mod    Electrical Stimulation Parameters  80-150 hz x10 mins    Electrical Stimulation Goals  Pain;Edema      Vasopneumatic   Number Minutes Vasopneumatic   10 minutes    Vasopnuematic Location   Shoulder    Vasopneumatic Pressure  Low    Vasopneumatic Temperature   34 for edema      Manual Therapy   Manual Therapy  Passive ROM    Joint Mobilization  II-III AP and inferior joint mobilizations to improve mobility    Passive ROM  left shoulder PROM into flexion, abd, IR and ER intermittent oscillations for muscle relaxation                  PT Long Term Goals - 02/06/20 0743      PT LONG TERM GOAL #1   Title  Patient will be independent with HEP    Time  6    Period  Weeks    Status  On-going      PT LONG TERM GOAL #2   Title  Patient will demonstrate 140+ degrees of left shoulder flexion AROM to improve ability to perform overhead tasks.    Time  6    Period  Weeks    Status  On-going      PT LONG TERM GOAL #3   Title  Patient will demonstrate 55+ degrees of left shoulder ER AROM to improve donning and doffing apparel.    Time  6    Period  Weeks    Status  On-going      PT LONG TERM GOAL #4   Title  Patient will demonstrate 4/5 or greater  left shoulder MMT in all planes to improve stability during functional tasks.    Time  6    Period  Weeks    Status  On-going      PT LONG TERM GOAL #5   Title  Patient will be independent with ADLs and with pain less than or equal to 2/10 in left shoulder.    Time  6    Period  Weeks    Status  On-going            Plan - 02/18/20 0910    Clinical Impression Statement  Patient responded fairly well but with ongoing "crunching and grinding sensation" with AROM. Patient is progressing towards AROM but requires frequent cuing for form to prevent compensatory motions. Patient instructed to bend elbow with wall circles during HEP to reduce pain and discomfort to which he reported agreement. No adverse affects upon removal of modalities.    Personal Factors and Comorbidities  Age;Comorbidity 1    Comorbidities  left RTC repair 12/10/2019    Examination-Activity Limitations  Bathing;Reach Overhead;Carry;Dressing;Hygiene/Grooming;Lift    Stability/Clinical Decision Making  Stable/Uncomplicated    Clinical Decision Making  Low    Rehab Potential  Excellent    PT Frequency  3x / week    PT Duration  6 weeks    PT Treatment/Interventions  ADLs/Self Care Home Management;Cryotherapy;Electrical Stimulation;Moist Heat;Ultrasound;Neuromuscular re-education;Therapeutic activities;Therapeutic exercise;Functional mobility training;Patient/family education;Manual techniques;Vasopneumatic Device;Taping;Passive range of motion    PT Next Visit Plan  cont with PROM to left shoulder AAROM and progress to AROM per protocol, see media; modalities PRN for pain relief  (10 weeks 02/19/2020)    PT Home Exercise Plan  see patient education section    Consulted and Agree with Plan of Care  Patient       Patient will benefit from skilled therapeutic intervention in order to improve the following deficits and impairments:  Decreased activity tolerance, Decreased strength, Decreased range of motion, Pain, Impaired UE  functional use, Postural dysfunction  Visit Diagnosis: Stiffness of left shoulder, not elsewhere classified  Muscle weakness (generalized)  Problem List There are no problems to display for this patient.   Guss Bunde, PT, DPT 02/18/2020, 9:15 AM  [facility] [address] [place], [place], [address] Phone: [phone]   Fax:  [phone]  Name: Burrell Hodapp MRN: [id number] Date of Birth: [birth year]

## 2020-02-20 ENCOUNTER — Other Ambulatory Visit: Payer: Self-pay

## 2020-02-20 ENCOUNTER — Encounter: Payer: Self-pay | Admitting: Physical Therapy

## 2020-02-20 ENCOUNTER — Ambulatory Visit: Payer: Worker's Compensation | Admitting: Physical Therapy

## 2020-02-20 DIAGNOSIS — M6281 Muscle weakness (generalized): Secondary | ICD-10-CM

## 2020-02-20 DIAGNOSIS — M25612 Stiffness of left shoulder, not elsewhere classified: Secondary | ICD-10-CM | POA: Diagnosis not present

## 2020-02-20 NOTE — Therapy (Signed)
[facility] [address] [place], [place], [address] Phone: [phone]   Fax:  [phone]  Physical Therapy Treatment  Patient Details  Name: Alvin Sanchez MRN: [id number] Date of Birth: [birth year] Referring Provider (PT): Jones Broom, MD   Encounter Date: 02/20/2020  PT End of Session - 02/20/20 0740    Visit Number  16    Number of Visits  18    Date for PT Re-Evaluation  02/26/20    Authorization Type  Worker's compensation    Authorization - Visit Number  16    Authorization - Number of Visits  18    PT Start Time  0731    PT Stop Time  0820    PT Time Calculation (min)  49 min    Activity Tolerance  Patient tolerated treatment well    Behavior During Therapy  [facility] for tasks assessed/performed       Past Medical History:  Diagnosis Date  . Anxiety   . Kidney stones     Past Surgical History:  Procedure Laterality Date  . FRACTURE SURGERY     toe  . ROTATOR CUFF REPAIR Left 12/10/2019  . THYROIDECTOMY Right 02/04/2011   Dr. Sandi Carne, [facility]    There were no vitals filed for this visit.  Subjective Assessment - 02/20/20 0739    Subjective  COVID-19 screening performed upon arrival. Patient arrives with no new complaints.    Pertinent History  left RTC repair 12/10/2019    Limitations  Lifting;House hold activities    Patient Stated Goals  use arm normally again.    Currently in Pain?  No/denies         [facility] PT Assessment - 02/20/20 0001      Assessment   Medical Diagnosis  Traumatic rotator cuff tear left; shoulder impingement, left    Referring Provider (PT)  Jones Broom, MD    Onset Date/Surgical Date  12/10/19    Hand Dominance  Right    Next MD Visit  02/26/2020    Prior Therapy  no      Precautions   Precautions  Shoulder    Precaution Comments  Follow RTC protocol see media                   [facility] Adult PT Treatment/Exercise - 02/20/20 0001      Shoulder  Exercises: Supine   Protraction  AROM;Left;10 reps    External Rotation  AROM;20 reps    Flexion  AROM;Left;10 reps      Shoulder Exercises: Standing   Internal Rotation  AAROM;Both;20 reps    Internal Rotation Limitations  behind the back IR    Flexion  AAROM;Left;20 reps    Flexion Limitations  with wand    ABduction  AAROM;Left;20 reps    ABduction Limitations  with wand    Extension  AAROM;Both;20 reps      Shoulder Exercises: Pulleys   Flexion  5 minutes      Shoulder Exercises: ROM/Strengthening   Ranger  standing ranger flexion, CW, CCW x3 mins each      Electrical Stimulation   Electrical Stimulation Location  left shoulder    Electrical Stimulation Action  pre-mod    Electrical Stimulation Parameters  80-150 hz x10 mins    Electrical Stimulation Goals  Pain;Edema      Vasopneumatic   Number Minutes Vasopneumatic   10 minutes    Vasopnuematic Location   Shoulder    Vasopneumatic Pressure  Low    Vasopneumatic Temperature   34                  PT Long Term Goals - 02/06/20 0743      PT LONG TERM GOAL #1   Title  Patient will be independent with HEP    Time  6    Period  Weeks    Status  On-going      PT LONG TERM GOAL #2   Title  Patient will demonstrate 140+ degrees of left shoulder flexion AROM to improve ability to perform overhead tasks.    Time  6    Period  Weeks    Status  On-going      PT LONG TERM GOAL #3   Title  Patient will demonstrate 55+ degrees of left shoulder ER AROM to improve donning and doffing apparel.    Time  6    Period  Weeks    Status  On-going      PT LONG TERM GOAL #4   Title  Patient will demonstrate 4/5 or greater left shoulder MMT in all planes to improve stability during functional tasks.    Time  6    Period  Weeks    Status  On-going      PT LONG TERM GOAL #5   Title  Patient will be independent with ADLs and with pain less than or equal to 2/10 in left shoulder.    Time  6    Period  Weeks    Status   On-going            Plan - 02/20/20 0803    Clinical Impression Statement  Patient was able to tolerate treatment well with the addition of new standing AAROM TEs. Patient denied grinding with behind the back and IR AAROM but still with grinding with AAROM shoulder flexion and abduction. No adverse affects upon removal of modalities.    Personal Factors and Comorbidities  Age;Comorbidity 1    Comorbidities  left RTC repair 12/10/2019    Examination-Activity Limitations  Bathing;Reach Overhead;Carry;Dressing;Hygiene/Grooming;Lift    Stability/Clinical Decision Making  Stable/Uncomplicated    Clinical Decision Making  Low    Rehab Potential  Excellent    PT Frequency  3x / week    PT Duration  6 weeks    PT Treatment/Interventions  ADLs/Self Care Home Management;Cryotherapy;Electrical Stimulation;Moist Heat;Ultrasound;Neuromuscular re-education;Therapeutic activities;Therapeutic exercise;Functional mobility training;Patient/family education;Manual techniques;Vasopneumatic Device;Taping;Passive range of motion    PT Next Visit Plan  MD note; cont with PROM to left shoulder AAROM and progress to AROM per protocol, see media; modalities PRN for pain relief  (10 weeks 02/19/2020)    PT Home Exercise Plan  see patient education section    Consulted and Agree with Plan of Care  Patient       Patient will benefit from skilled therapeutic intervention in order to improve the following deficits and impairments:  Decreased activity tolerance, Decreased strength, Decreased range of motion, Pain, Impaired UE functional use, Postural dysfunction  Visit Diagnosis: Stiffness of left shoulder, not elsewhere classified  Muscle weakness (generalized)     Problem List There are no problems to display for this patient.   [name], PT, DPT 02/20/2020, 8:29 AM  [facility] [address] [place], [place], [address] Phone: [phone]   Fax:   [phone]  Name: Alvin Sanchez MRN: [id number] Date of Birth: Nov 05, 1956

## 2020-02-25 ENCOUNTER — Other Ambulatory Visit: Payer: Self-pay

## 2020-02-25 ENCOUNTER — Encounter: Payer: Self-pay | Admitting: Physical Therapy

## 2020-02-25 ENCOUNTER — Ambulatory Visit: Payer: Worker's Compensation | Admitting: Physical Therapy

## 2020-02-25 DIAGNOSIS — M25612 Stiffness of left shoulder, not elsewhere classified: Secondary | ICD-10-CM

## 2020-02-25 DIAGNOSIS — M6281 Muscle weakness (generalized): Secondary | ICD-10-CM

## 2020-02-25 NOTE — Therapy (Signed)
[facility] [place], [place], [address] Phone: [phone]   Fax:  [phone]  Physical Therapy Treatment  Patient Details  Name: Alvin Sanchez MRN: [id number] Date of Birth: June 25, 1956 Referring Provider (PT): Tania Ade, MD   Encounter Date: 02/25/2020  PT End of Session - 02/25/20 0738    Visit Number  17    Number of Visits  18    Date for PT Re-Evaluation  02/26/20    Authorization Type  Worker's compensation    Authorization - Visit Number  29    Authorization - Number of Visits  18    PT Start Time  [phone]    PT Stop Time  0820    PT Time Calculation (min)  47 min    Activity Tolerance  Patient tolerated treatment well    Behavior During Therapy  [facility] for tasks assessed/performed       Past Medical History:  Diagnosis Date  . Anxiety   . Kidney stones     Past Surgical History:  Procedure Laterality Date  . FRACTURE SURGERY     toe  . ROTATOR CUFF REPAIR Left 12/10/2019  . THYROIDECTOMY Right 02/04/2011   Dr. Hardie Lora, [facility]    There were no vitals filed for this visit.  Subjective Assessment - 02/25/20 0733    Subjective  COVID-19 screening performed upon arrival. Patient reports continued popping and cracking of L shoulder.    Pertinent History  left RTC repair 12/10/2019    Limitations  Lifting;House hold activities    Patient Stated Goals  use arm normally again.    Currently in Pain?  Other (Comment)   No pain assessment provided by patient        [facility] PT Assessment - 02/25/20 0001      Assessment   Medical Diagnosis  Traumatic rotator cuff tear left; shoulder impingement, left    Referring Provider (PT)  Tania Ade, MD    Onset Date/Surgical Date  12/10/19    Hand Dominance  Right    Next MD Visit  02/26/2020    Prior Therapy  no      Precautions   Precautions  Shoulder    Precaution Comments  Follow RTC protocol see media      ROM / Strength   AROM / PROM /  Strength  AROM      AROM   Overall AROM   Within functional limits for tasks performed   in supine   AROM Assessment Site  Shoulder    Right/Left Shoulder  Left    Left Shoulder Flexion  145 Degrees    Left Shoulder External Rotation  80 Degrees                   [facility] Adult PT Treatment/Exercise - 02/25/20 0001      Shoulder Exercises: Supine   Protraction  AROM;Left;20 reps    Protraction Limitations  crepitus noted in L shoulder    Flexion  AROM;Left;20 reps      Shoulder Exercises: Standing   Internal Rotation  AAROM;Both;20 reps    Internal Rotation Limitations  behind the back IR    Flexion  AAROM;Left;20 reps    Flexion Limitations  with wand    ABduction  AAROM;Left;20 reps    ABduction Limitations  with wand; reported discomfort in proximal humerus region even in ER    Extension  AAROM;Both;20 reps      Shoulder Exercises: Pulleys  Flexion  5 minutes      Shoulder Exercises: ROM/Strengthening   Ranger  standing ranger flexion, CW, CCW x3 mins each      Modalities   Modalities  Programmer, applications Location  L shoulder    Electrical Stimulation Action  Pre-Mod    Electrical Stimulation Parameters  80-150 hz x15 min    Electrical Stimulation Goals  Pain;Edema      Vasopneumatic   Number Minutes Vasopneumatic   15 minutes    Vasopnuematic Location   Shoulder    Vasopneumatic Pressure  Low    Vasopneumatic Temperature   34      Manual Therapy   Manual Therapy  Passive ROM    Passive ROM  PROM of L shoulder into flexion, ER with gentle holds at end range and intermittant oscillations required                  PT Long Term Goals - 02/25/20 0806      PT LONG TERM GOAL #1   Title  Patient will be independent with HEP    Time  6    Period  Weeks    Status  Achieved      PT LONG TERM GOAL #2   Title  Patient will demonstrate 140+ degrees of left shoulder flexion  AROM to improve ability to perform overhead tasks.    Time  6    Period  Weeks    Status  On-going      PT LONG TERM GOAL #3   Title  Patient will demonstrate 55+ degrees of left shoulder ER AROM to improve donning and doffing apparel.    Time  6    Period  Weeks    Status  Achieved      PT LONG TERM GOAL #4   Title  Patient will demonstrate 4/5 or greater left shoulder MMT in all planes to improve stability during functional tasks.    Time  6    Period  Weeks    Status  On-going      PT LONG TERM GOAL #5   Title  Patient will be independent with ADLs and with pain less than or equal to 2/10 in left shoulder.    Time  6    Period  Weeks    Status  On-going            Plan - 02/25/20 0807    Clinical Impression Statement  Patient presented in clinic with reports of continued crepitus of L shoulder with ROM activities. Patient able to complete HEP but better with comfort if close to his body and unable to allow LUE against gravity for long periods of time due to discomfort. More crepitus and pain reported by patient with flexion exericses but none behind his back. Firm end feels and smooth arc of motion noted during PROM of L shoulder although discomfort at end range flexion. ROM assesment provided in today's note. Normal modalities response noted following removal of the modalities.    Personal Factors and Comorbidities  Age;Comorbidity 1    Comorbidities  left RTC repair 12/10/2019    Examination-Activity Limitations  Bathing;Reach Overhead;Carry;Dressing;Hygiene/Grooming;Lift    Stability/Clinical Decision Making  Stable/Uncomplicated    Rehab Potential  Excellent    PT Frequency  3x / week    PT Duration  6 weeks    PT Treatment/Interventions  ADLs/Self Care Home Management;Cryotherapy;Electrical Stimulation;Moist Heat;Ultrasound;Neuromuscular  re-education;Therapeutic activities;Therapeutic exercise;Functional mobility training;Patient/family education;Manual  techniques;Vasopneumatic Device;Taping;Passive range of motion    PT Next Visit Plan  MD note for MD appointment on 02/26/2020.    PT Home Exercise Plan  see patient education section    Consulted and Agree with Plan of Care  Patient       Patient will benefit from skilled therapeutic intervention in order to improve the following deficits and impairments:  Decreased activity tolerance, Decreased strength, Decreased range of motion, Pain, Impaired UE functional use, Postural dysfunction  Visit Diagnosis: Stiffness of left shoulder, not elsewhere classified  Muscle weakness (generalized)     Problem List There are no problems to display for this patient.   Marvell Fuller, PTA 02/25/20 9:02 AM   [facility] [address] [place], [place], [address] Phone: [phone]   Fax:  [phone]  Name: Alvin Sanchez MRN: [id number] Date of Birth: [birth year]

## 2020-02-27 ENCOUNTER — Other Ambulatory Visit: Payer: Self-pay

## 2020-02-27 ENCOUNTER — Ambulatory Visit: Payer: Worker's Compensation | Admitting: Physical Therapy

## 2020-02-27 ENCOUNTER — Encounter: Payer: Self-pay | Admitting: Physical Therapy

## 2020-02-27 DIAGNOSIS — M6281 Muscle weakness (generalized): Secondary | ICD-10-CM

## 2020-02-27 DIAGNOSIS — M25612 Stiffness of left shoulder, not elsewhere classified: Secondary | ICD-10-CM | POA: Diagnosis not present

## 2020-02-27 NOTE — Therapy (Signed)
[facility] [address] [place], [place], [address] Phone: [phone]   Fax:  [phone]  Physical Therapy Treatment  Patient Details  Name: Alvin Sanchez MRN: [id number] Date of Birth: [birth year] Referring Provider (PT): Jones Broom, MD   Encounter Date: 02/27/2020  PT End of Session - 02/27/20 0733    Visit Number  18    Number of Visits  36    Date for PT Re-Evaluation  04/08/20   per NO by Dr. Ave Filter   Authorization Type  Worker's compensation    Authorization - Visit Number  18    Authorization - Number of Visits  36    PT Start Time  [phone]    PT Stop Time  0820    PT Time Calculation (min)  49 min    Activity Tolerance  Patient tolerated treatment well    Behavior During Therapy  [facility] for tasks assessed/performed       Past Medical History:  Diagnosis Date  . Anxiety   . Kidney stones     Past Surgical History:  Procedure Laterality Date  . FRACTURE SURGERY     toe  . ROTATOR CUFF REPAIR Left 12/10/2019  . THYROIDECTOMY Right 02/04/2011   Dr. Sandi Carne, [facility]    There were no vitals filed for this visit.  Subjective Assessment - 02/27/20 0732    Subjective  COVID-19 screening performed upon arrival. Patient reports he is to continue PT for another 6 weeks but MD reported possibility of TSR.    Pertinent History  left RTC repair 12/10/2019    Limitations  Lifting;House hold activities    Patient Stated Goals  use arm normally again.    Currently in Pain?  No/denies         [facility] PT Assessment - 02/27/20 0001      Assessment   Medical Diagnosis  Traumatic rotator cuff tear left; shoulder impingement, left    Referring Provider (PT)  Jones Broom, MD    Onset Date/Surgical Date  12/10/19    Hand Dominance  Right    Next MD Visit  04/2020    Prior Therapy  no      Precautions   Precautions  Shoulder    Precaution Comments  Follow RTC protocol see media                    [facility] Adult PT Treatment/Exercise - 02/27/20 0001      Shoulder Exercises: Supine   Flexion  AAROM;Left;15 reps      Shoulder Exercises: Seated   Flexion  AROM;Left;10 reps   fatigued quickly and lacked full range in lawnchair progress     Shoulder Exercises: Prone   Retraction  AROM;Left;20 reps    Flexion  AROM;Left;20 reps    Extension  AROM;Left;20 reps      Shoulder Exercises: Sidelying   External Rotation  AROM;Left;20 reps      Shoulder Exercises: Pulleys   Flexion  5 minutes      Shoulder Exercises: ROM/Strengthening   Ranger  standing ranger flexion x20 reps    Wall Wash  LUE CW and CCW circles x20 reps each      Modalities   Modalities  Electrical Stimulation;Vasopneumatic      Programme researcher, broadcasting/film/video Location  L shoulder    Electrical Stimulation Action  Pre-Mod    Electrical Stimulation Parameters  80-150 hz x8min    Electrical Stimulation Goals  Pain;Edema  Vasopneumatic   Number Minutes Vasopneumatic   15 minutes    Vasopnuematic Location   Shoulder    Vasopneumatic Pressure  Low    Vasopneumatic Temperature   34      Manual Therapy   Manual Therapy  Passive ROM    Passive ROM  PROM of L shoulder into flexion, ER with gentle holds at end range and intermittant oscillations required                  PT Long Term Goals - 02/25/20 0806      PT LONG TERM GOAL #1   Title  Patient will be independent with HEP    Time  6    Period  Weeks    Status  Achieved      PT LONG TERM GOAL #2   Title  Patient will demonstrate 140+ degrees of left shoulder flexion AROM to improve ability to perform overhead tasks.    Time  6    Period  Weeks    Status  On-going      PT LONG TERM GOAL #3   Title  Patient will demonstrate 55+ degrees of left shoulder ER AROM to improve donning and doffing apparel.    Time  6    Period  Weeks    Status  Achieved      PT LONG TERM GOAL #4   Title  Patient  will demonstrate 4/5 or greater left shoulder MMT in all planes to improve stability during functional tasks.    Time  6    Period  Weeks    Status  On-going      PT LONG TERM GOAL #5   Title  Patient will be independent with ADLs and with pain less than or equal to 2/10 in left shoulder.    Time  6    Period  Weeks    Status  On-going            Plan - 02/27/20 7829    Clinical Impression Statement  Patient presented in clinic with no reports of L shoulder pain. Continued crepitus noted with L shoulder motion especially into flexion. Attempt at antigravity shoulder flexion intiated today but less than 90 deg of AROM flexion completed due to fatigue and weakness. Less crepitus noted with pressure over L proximal bicep attachment during PROM session. Firm end feels and smooth arc of motion noted during PROM in all directions. Normal modalities response noted following removal of the modalities.    Personal Factors and Comorbidities  Age;Comorbidity 1    Comorbidities  left RTC repair 12/10/2019    Examination-Activity Limitations  Bathing;Reach Overhead;Carry;Dressing;Hygiene/Grooming;Lift    Stability/Clinical Decision Making  Stable/Uncomplicated    Rehab Potential  Excellent    PT Frequency  3x / week    PT Duration  6 weeks    PT Treatment/Interventions  ADLs/Self Care Home Management;Cryotherapy;Electrical Stimulation;Moist Heat;Ultrasound;Neuromuscular re-education;Therapeutic activities;Therapeutic exercise;Functional mobility training;Patient/family education;Manual techniques;Vasopneumatic Device;Taping;Passive range of motion    PT Next Visit Plan  Continue per MD.    PT Home Exercise Plan  see patient education section    Consulted and Agree with Plan of Care  Patient       Patient will benefit from skilled therapeutic intervention in order to improve the following deficits and impairments:  Decreased activity tolerance, Decreased strength, Decreased range of motion, Pain,  Impaired UE functional use, Postural dysfunction  Visit Diagnosis: Stiffness of left shoulder, not elsewhere classified  Muscle weakness (  generalized)     Problem List There are no problems to display for this patient.   Marvell Fuller, PTA 02/27/2020, 9:12 AM  [facility] [address] [place], [place], [address] Phone: [phone]   Fax:  [phone]  Name: Alvin Sanchez MRN: [id number] Date of Birth: July 23, 1956

## 2020-03-03 ENCOUNTER — Ambulatory Visit: Payer: Worker's Compensation | Admitting: Physical Therapy

## 2020-03-03 ENCOUNTER — Encounter: Payer: Self-pay | Admitting: Physical Therapy

## 2020-03-03 ENCOUNTER — Other Ambulatory Visit: Payer: Self-pay

## 2020-03-03 DIAGNOSIS — M25612 Stiffness of left shoulder, not elsewhere classified: Secondary | ICD-10-CM | POA: Diagnosis not present

## 2020-03-03 DIAGNOSIS — M6281 Muscle weakness (generalized): Secondary | ICD-10-CM

## 2020-03-03 NOTE — Therapy (Signed)
[facility] [place], [place], [address] Phone: [phone]   Fax:  [phone]  Physical Therapy Treatment  Patient Details  Name: Alvin Sanchez MRN: [id number] Date of Birth: 06/22/1956 Referring Provider (PT): Tania Ade, MD   Encounter Date: 03/03/2020  PT End of Session - 03/03/20 0817    Visit Number  19    Number of Visits  36    Date for PT Re-Evaluation  04/08/20    Authorization Type  Worker's compensation    Authorization - Visit Number  27    Authorization - Number of Visits  36    PT Start Time  0731    PT Stop Time  0826    PT Time Calculation (min)  55 min    Activity Tolerance  Patient tolerated treatment well    Behavior During Therapy  [facility] for tasks assessed/performed       Past Medical History:  Diagnosis Date  . Anxiety   . Kidney stones     Past Surgical History:  Procedure Laterality Date  . FRACTURE SURGERY     toe  . ROTATOR CUFF REPAIR Left 12/10/2019  . THYROIDECTOMY Right 02/04/2011   Dr. Hardie Lora, [facility]    There were no vitals filed for this visit.  Subjective Assessment - 03/03/20 0741    Subjective  COVID-19 screening performed upon arrival. Patient reports ongoing pain with movement.    Pertinent History  left RTC repair 12/10/2019    Limitations  Lifting;House hold activities    Patient Stated Goals  use arm normally again.    Currently in Pain?  No/denies         [facility] PT Assessment - 03/03/20 0001      Assessment   Medical Diagnosis  Traumatic rotator cuff tear left; shoulder impingement, left    Referring Provider (PT)  Tania Ade, MD    Onset Date/Surgical Date  12/10/19    Hand Dominance  Right    Next MD Visit  04/2020    Prior Therapy  no      Precautions   Precautions  Shoulder    Precaution Comments  Follow RTC protocol see media                   [facility] Adult PT Treatment/Exercise - 03/03/20 0001      Shoulder  Exercises: Prone   Retraction  AROM;Left;20 reps    Flexion  AROM;Left;20 reps    Extension  AROM;Left;20 reps      Shoulder Exercises: Pulleys   Flexion  5 minutes      Shoulder Exercises: ROM/Strengthening   Ranger  standing ranger flexion, CW, CCW x3 minutes      Modalities   Modalities  Electrical Stimulation;Vasopneumatic      Electrical Stimulation   Electrical Stimulation Location  L shoulder    Electrical Stimulation Action  pre-mod    Electrical Stimulation Parameters  80-150 hz x15 mins    Electrical Stimulation Goals  Pain;Edema      Vasopneumatic   Number Minutes Vasopneumatic   15 minutes    Vasopnuematic Location   Shoulder    Vasopneumatic Pressure  Low    Vasopneumatic Temperature   34      Manual Therapy   Manual Therapy  Passive ROM    Passive ROM  PROM of L shoulder into flexion, ER with gentle holds at end range and intermittant oscillations required  PT Long Term Goals - 02/25/20 0806      PT LONG TERM GOAL #1   Title  Patient will be independent with HEP    Time  6    Period  Weeks    Status  Achieved      PT LONG TERM GOAL #2   Title  Patient will demonstrate 140+ degrees of left shoulder flexion AROM to improve ability to perform overhead tasks.    Time  6    Period  Weeks    Status  On-going      PT LONG TERM GOAL #3   Title  Patient will demonstrate 55+ degrees of left shoulder ER AROM to improve donning and doffing apparel.    Time  6    Period  Weeks    Status  Achieved      PT LONG TERM GOAL #4   Title  Patient will demonstrate 4/5 or greater left shoulder MMT in all planes to improve stability during functional tasks.    Time  6    Period  Weeks    Status  On-going      PT LONG TERM GOAL #5   Title  Patient will be independent with ADLs and with pain less than or equal to 2/10 in left shoulder.    Time  6    Period  Weeks    Status  On-going            Plan - 03/03/20 0818    Clinical  Impression Statement  Patient arrives to physical therapy with ongoing pain in the left shoulder. Patient responded fairly to session but with fatigue Patient and PT discussed plan of care as well as overview of anatomy of shoulder. Smooth arc of motion in all planes with no reports of crepitus with PROM.    Personal Factors and Comorbidities  Age;Comorbidity 1    Comorbidities  left RTC repair 12/10/2019    Examination-Activity Limitations  Bathing;Reach Overhead;Carry;Dressing;Hygiene/Grooming;Lift    Stability/Clinical Decision Making  Stable/Uncomplicated    Clinical Decision Making  Low    Rehab Potential  Excellent    PT Frequency  3x / week    PT Duration  6 weeks    PT Treatment/Interventions  ADLs/Self Care Home Management;Cryotherapy;Electrical Stimulation;Moist Heat;Ultrasound;Neuromuscular re-education;Therapeutic activities;Therapeutic exercise;Functional mobility training;Patient/family education;Manual techniques;Vasopneumatic Device;Taping;Passive range of motion    PT Next Visit Plan  Continue per MD.    PT Home Exercise Plan  see patient education section    Consulted and Agree with Plan of Care  Patient       Patient will benefit from skilled therapeutic intervention in order to improve the following deficits and impairments:  Decreased activity tolerance, Decreased strength, Decreased range of motion, Pain, Impaired UE functional use, Postural dysfunction  Visit Diagnosis: Stiffness of left shoulder, not elsewhere classified  Muscle weakness (generalized)     Problem List There are no problems to display for this patient.   Guss Bunde, PT, DPT 03/03/2020, 8:37 AM  [facility] [address] [place], [place], [address] Phone: [phone]   Fax:  [phone]  Name: Davidjames Blansett MRN: [id number] Date of Birth: [birth year]

## 2020-03-05 ENCOUNTER — Ambulatory Visit: Payer: Worker's Compensation | Attending: Orthopedic Surgery | Admitting: Physical Therapy

## 2020-03-05 ENCOUNTER — Encounter: Payer: Self-pay | Admitting: Physical Therapy

## 2020-03-05 ENCOUNTER — Other Ambulatory Visit: Payer: Self-pay

## 2020-03-05 DIAGNOSIS — M6281 Muscle weakness (generalized): Secondary | ICD-10-CM | POA: Diagnosis present

## 2020-03-05 DIAGNOSIS — M25612 Stiffness of left shoulder, not elsewhere classified: Secondary | ICD-10-CM | POA: Diagnosis not present

## 2020-03-05 NOTE — Therapy (Signed)
[facility] [address] [place], [place], [address] Phone: [phone]   Fax:  [phone]  Physical Therapy Treatment  Progress Note Reporting Period 01/30/2020 to 03/05/2020  See note below for Objective Data and Assessment of Progress/Goals. Goals ongoing at this time secondary to pain.     Patient Details  Name: Alvin Sanchez MRN: [id number] Date of Birth: 06/07/1956 Referring Provider (PT): Jones Broom, MD   Encounter Date: 03/05/2020  PT End of Session - 03/05/20 0741    Visit Number  20    Number of Visits  36    Date for PT Re-Evaluation  04/08/20    Authorization Type  Worker's compensation    Authorization - Visit Number  20    Authorization - Number of Visits  36    PT Start Time  0730    PT Stop Time  0826    PT Time Calculation (min)  56 min    Equipment Utilized During Treatment  Other (comment)    Activity Tolerance  Patient tolerated treatment well    Behavior During Therapy  [facility] for tasks assessed/performed       Past Medical History:  Diagnosis Date  . Anxiety   . Kidney stones     Past Surgical History:  Procedure Laterality Date  . FRACTURE SURGERY     toe  . ROTATOR CUFF REPAIR Left 12/10/2019  . THYROIDECTOMY Right 02/04/2011   Dr. Sandi Carne, [facility]    There were no vitals filed for this visit.  Subjective Assessment - 03/05/20 0741    Subjective  COVID-19 screening performed upon arrival. Patient reports feeling fair.    Pertinent History  left RTC repair 12/10/2019    Limitations  Lifting;House hold activities    Patient Stated Goals  use arm normally again.    Currently in Pain?  No/denies         [facility] PT Assessment - 03/05/20 0001      Assessment   Medical Diagnosis  Traumatic rotator cuff tear left; shoulder impingement, left    Referring Provider (PT)  Jones Broom, MD    Onset Date/Surgical Date  12/10/19    Hand Dominance  Right    Next MD Visit  04/2020    Prior Therapy  no      Precautions   Precautions  Shoulder    Precaution Comments  Follow RTC protocol see media                   [facility] Adult PT Treatment/Exercise - 03/05/20 0001      Shoulder Exercises: Supine   Protraction  AROM;Left;20 reps      Shoulder Exercises: Seated   Flexion  Left;AAROM;Other (comment)   x2 mins with wand for assistance     Shoulder Exercises: Sidelying   External Rotation  AROM;Left;20 reps      Shoulder Exercises: Standing   External Rotation  AAROM;Both;20 reps;10 reps      Shoulder Exercises: Pulleys   Flexion  5 minutes      Shoulder Exercises: ROM/Strengthening   Wall Wash  flexion CW and CCW circles x3 mins each      Modalities   Modalities  Electrical Stimulation;Vasopneumatic      Electrical Stimulation   Electrical Stimulation Location  L shoulder    Electrical Stimulation Action  pre-mod    Electrical Stimulation Parameters  80-150 hz x15 mins    Electrical Stimulation Goals  Pain;Edema  Vasopneumatic   Number Minutes Vasopneumatic   15 minutes    Vasopnuematic Location   Shoulder    Vasopneumatic Pressure  Low    Vasopneumatic Temperature   34      Manual Therapy   Manual Therapy  Passive ROM    Joint Mobilization  II-III AP and inferior joint mobilizations to improve mobility    Passive ROM  PROM of L shoulder into flexion, ER with gentle holds at end range and intermittant oscillations required                  PT Long Term Goals - 03/05/20 0742      PT LONG TERM GOAL #1   Title  Patient will be independent with HEP    Time  6    Period  Weeks    Status  Achieved      PT LONG TERM GOAL #2   Title  Patient will demonstrate 140+ degrees of left shoulder flexion AROM to improve ability to perform overhead tasks.    Time  6    Period  Weeks    Status  On-going      PT LONG TERM GOAL #3   Title  Patient will demonstrate 55+ degrees of left shoulder ER AROM to improve donning and doffing  apparel.    Time  6    Period  Weeks    Status  Achieved      PT LONG TERM GOAL #4   Title  Patient will demonstrate 4/5 or greater left shoulder MMT in all planes to improve stability during functional tasks.    Time  6    Period  Weeks    Status  On-going      PT LONG TERM GOAL #5   Title  Patient will be independent with ADLs and with pain less than or equal to 2/10 in left shoulder.    Time  6    Period  Weeks    Status  On-going            Plan - 03/05/20 0809    Clinical Impression Statement  Patient responded fairly today to exercise with reports of increasing fatigue in left shoulder. Patient with ongoing grinding pain that limits his ability to perform TEs and functional tasks at home. Patient and PT discussed to write down questions to ask regarding shoulder replacement and return to work. Patient reported understanding. Goals ongoing at this time secondary to pain and muscle weakness.    Personal Factors and Comorbidities  Age;Comorbidity 1    Comorbidities  left RTC repair 12/10/2019    Examination-Activity Limitations  Bathing;Reach Overhead;Carry;Dressing;Hygiene/Grooming;Lift    Stability/Clinical Decision Making  Stable/Uncomplicated    Clinical Decision Making  Low    Rehab Potential  Excellent    PT Frequency  3x / week    PT Duration  6 weeks    PT Treatment/Interventions  ADLs/Self Care Home Management;Cryotherapy;Electrical Stimulation;Moist Heat;Ultrasound;Neuromuscular re-education;Therapeutic activities;Therapeutic exercise;Functional mobility training;Patient/family education;Manual techniques;Vasopneumatic Device;Taping;Passive range of motion    PT Next Visit Plan  Continue per MD.    PT Home Exercise Plan  see patient education section    Consulted and Agree with Plan of Care  Patient       Patient will benefit from skilled therapeutic intervention in order to improve the following deficits and impairments:  Decreased activity tolerance, Decreased  strength, Decreased range of motion, Pain, Impaired UE functional use, Postural dysfunction  Visit Diagnosis: Stiffness of left  shoulder, not elsewhere classified  Muscle weakness (generalized)     Problem List There are no problems to display for this patient.   [name], PT, DPT 03/05/2020, 8:36 AM  [facility] [address] [place], [place], [address] Phone: [phone]   Fax:  [phone]  Name: Alvin Sanchez MRN: [id number] Date of Birth: 1956/04/05

## 2020-03-10 ENCOUNTER — Ambulatory Visit: Payer: Worker's Compensation | Attending: Orthopedic Surgery | Admitting: Physical Therapy

## 2020-03-10 ENCOUNTER — Other Ambulatory Visit: Payer: Self-pay

## 2020-03-10 DIAGNOSIS — M25612 Stiffness of left shoulder, not elsewhere classified: Secondary | ICD-10-CM | POA: Diagnosis not present

## 2020-03-10 DIAGNOSIS — M6281 Muscle weakness (generalized): Secondary | ICD-10-CM | POA: Insufficient documentation

## 2020-03-10 NOTE — Therapy (Signed)
[facility] [address] [place], [place], [address] Phone: [phone]   Fax:  [phone]  Physical Therapy Treatment  Patient Details  Name: Alvin Sanchez MRN: [id number] Date of Birth: 1956/01/16 Referring Provider (PT): Jones Broom, MD   Encounter Date: 03/10/2020  PT End of Session - 03/10/20 0738    Visit Number  21    Number of Visits  36    Date for PT Re-Evaluation  04/08/20    Authorization Type  Worker's compensation    Authorization - Visit Number  21    Authorization - Number of Visits  36    PT Start Time  0734    PT Stop Time  0826    PT Time Calculation (min)  52 min    Activity Tolerance  Patient tolerated treatment well    Behavior During Therapy  [facility] for tasks assessed/performed       Past Medical History:  Diagnosis Date  . Anxiety   . Kidney stones     Past Surgical History:  Procedure Laterality Date  . FRACTURE SURGERY     toe  . ROTATOR CUFF REPAIR Left 12/10/2019  . THYROIDECTOMY Right 02/04/2011   Dr. Sandi Carne, [facility]    There were no vitals filed for this visit.  Subjective Assessment - 03/10/20 0738    Subjective  COVID-19 screening performed upon arrival. Patient reports no new complaints.    Pertinent History  left RTC repair 12/10/2019    Limitations  Lifting;House hold activities    Patient Stated Goals  use arm normally again.    Currently in Pain?  No/denies         [facility] PT Assessment - 03/10/20 0001      Assessment   Medical Diagnosis  Traumatic rotator cuff tear left; shoulder impingement, left    Referring Provider (PT)  Jones Broom, MD    Onset Date/Surgical Date  12/10/19    Hand Dominance  Right    Next MD Visit  04/2020    Prior Therapy  no      Precautions   Precautions  Shoulder    Precaution Comments  Follow RTC protocol see media                   [facility] Adult PT Treatment/Exercise - 03/10/20 0001      Shoulder Exercises: Supine    Protraction  AROM;Left;20 reps    Flexion  Left;AROM;20 reps    Flexion Limitations  --      Shoulder Exercises: Seated   Flexion  Left;AAROM;20 reps    Flexion Limitations  lawn chair position      Shoulder Exercises: Prone   Retraction  AROM;Left;20 reps      Shoulder Exercises: Sidelying   External Rotation  AROM;Left;20 reps      Shoulder Exercises: Pulleys   Flexion  5 minutes      Shoulder Exercises: ROM/Strengthening   UBE (Upper Arm Bike)  120 RPM x6 mins    Ranger  standing ranger flexion, CW, CCW x3 minutes each      Modalities   Modalities  Electrical Stimulation;Vasopneumatic      Electrical Stimulation   Electrical Stimulation Location  L shoulder    Electrical Stimulation Action  pre-mod    Electrical Stimulation Parameters  80-150 hz x15 mins    Electrical Stimulation Goals  Pain;Edema      Vasopneumatic   Number Minutes Vasopneumatic   15 minutes  Vasopnuematic Location   Shoulder    Vasopneumatic Pressure  Low    Vasopneumatic Temperature   34                  PT Long Term Goals - 03/05/20 0742      PT LONG TERM GOAL #1   Title  Patient will be independent with HEP    Time  6    Period  Weeks    Status  Achieved      PT LONG TERM GOAL #2   Title  Patient will demonstrate 140+ degrees of left shoulder flexion AROM to improve ability to perform overhead tasks.    Time  6    Period  Weeks    Status  On-going      PT LONG TERM GOAL #3   Title  Patient will demonstrate 55+ degrees of left shoulder ER AROM to improve donning and doffing apparel.    Time  6    Period  Weeks    Status  Achieved      PT LONG TERM GOAL #4   Title  Patient will demonstrate 4/5 or greater left shoulder MMT in all planes to improve stability during functional tasks.    Time  6    Period  Weeks    Status  On-going      PT LONG TERM GOAL #5   Title  Patient will be independent with ADLs and with pain less than or equal to 2/10 in left shoulder.     Time  6    Period  Weeks    Status  On-going            Plan - 03/10/20 0745    Clinical Impression Statement  Patient responded fairly to TEs with ongoing pain with "grinding" sensation with AAROM exercises. UBE initiated today and was instructed to use unaffected arm as needed secondary to fatigue. Patient still compliant with HEP despite ongoing pain. Normal response to modalities upon removal.    Personal Factors and Comorbidities  Age;Comorbidity 1    Comorbidities  left RTC repair 12/10/2019    Examination-Activity Limitations  Bathing;Reach Overhead;Carry;Dressing;Hygiene/Grooming;Lift    Stability/Clinical Decision Making  Stable/Uncomplicated    Clinical Decision Making  Low    Rehab Potential  Excellent    PT Frequency  3x / week    PT Duration  6 weeks    PT Treatment/Interventions  ADLs/Self Care Home Management;Cryotherapy;Electrical Stimulation;Moist Heat;Ultrasound;Neuromuscular re-education;Therapeutic activities;Therapeutic exercise;Functional mobility training;Patient/family education;Manual techniques;Vasopneumatic Device;Taping;Passive range of motion    PT Next Visit Plan  Continue AAROM and progress to AROM per protocol. 13 weeks post of 03/10/2020.    PT Home Exercise Plan  see patient education section    Consulted and Agree with Plan of Care  Patient       Patient will benefit from skilled therapeutic intervention in order to improve the following deficits and impairments:  Decreased activity tolerance, Decreased strength, Decreased range of motion, Pain, Impaired UE functional use, Postural dysfunction  Visit Diagnosis: Stiffness of left shoulder, not elsewhere classified  Muscle weakness (generalized)     Problem List There are no problems to display for this patient.   [name], PT, DPT 03/10/2020, 9:51 AM  [facility] [address] [place], [place], [address] Phone: [phone]   Fax:   [phone]  Name: Alvin Sanchez MRN: [id number] Date of Birth: 1956-04-23

## 2020-03-12 ENCOUNTER — Encounter: Payer: Self-pay | Admitting: Physical Therapy

## 2020-03-12 ENCOUNTER — Other Ambulatory Visit: Payer: Self-pay

## 2020-03-12 ENCOUNTER — Ambulatory Visit: Payer: Worker's Compensation | Admitting: Physical Therapy

## 2020-03-12 DIAGNOSIS — M25612 Stiffness of left shoulder, not elsewhere classified: Secondary | ICD-10-CM | POA: Diagnosis not present

## 2020-03-12 DIAGNOSIS — M6281 Muscle weakness (generalized): Secondary | ICD-10-CM

## 2020-03-12 NOTE — Therapy (Signed)
[facility] [address] [place], [place], [address] Phone: [phone]   Fax:  [phone]  Physical Therapy Treatment  Patient Details  Name: Alvin Sanchez MRN: [id number] Date of Birth: [birth year] Referring Provider (PT): Jones Broom, MD   Encounter Date: 03/12/2020  PT End of Session - 03/12/20 0736    Visit Number  22    Number of Visits  36    Date for PT Re-Evaluation  04/08/20    Authorization Type  Worker's compensation    Authorization - Visit Number  22    Authorization - Number of Visits  36    PT Start Time  0731    PT Stop Time  0822    PT Time Calculation (min)  51 min    Activity Tolerance  Patient tolerated treatment well    Behavior During Therapy  [facility] for tasks assessed/performed       Past Medical History:  Diagnosis Date  . Anxiety   . Kidney stones     Past Surgical History:  Procedure Laterality Date  . FRACTURE SURGERY     toe  . ROTATOR CUFF REPAIR Left 12/10/2019  . THYROIDECTOMY Right 02/04/2011   Dr. Sandi Carne, [facility]    There were no vitals filed for this visit.  Subjective Assessment - 03/12/20 0735    Subjective  COVID-19 screening performed upon arrival. Patient arrives with no new changes to pain with motion.    Pertinent History  left RTC repair 12/10/2019    Limitations  Lifting;House hold activities    Patient Stated Goals  use arm normally again.    Currently in Pain?  No/denies         [facility] PT Assessment - 03/12/20 0001      Assessment   Medical Diagnosis  Traumatic rotator cuff tear left; shoulder impingement, left    Referring Provider (PT)  Jones Broom, MD    Onset Date/Surgical Date  12/10/19    Hand Dominance  Right    Next MD Visit  04/2020    Prior Therapy  no      Precautions   Precautions  Shoulder    Precaution Comments  Follow RTC protocol see media                   [facility] Adult PT Treatment/Exercise - 03/12/20 0001      Shoulder Exercises: Seated   Flexion  Left;AAROM;20 reps    Flexion Limitations  lawn chair position      Shoulder Exercises: Prone   Retraction  AROM;Left;20 reps      Shoulder Exercises: Pulleys   Flexion  5 minutes      Shoulder Exercises: ROM/Strengthening   UBE (Upper Arm Bike)  120 RPM x8 mins    Ranger  standing ranger flexion, CW, CCW x3 minutes each      Modalities   Modalities  Electrical Stimulation;Vasopneumatic      Electrical Stimulation   Electrical Stimulation Location  L shoulder    Electrical Stimulation Action  pre-mod    Electrical Stimulation Parameters  80-150 hz x15 mins    Electrical Stimulation Goals  Pain;Edema      Vasopneumatic   Number Minutes Vasopneumatic   15 minutes    Vasopnuematic Location   Shoulder    Vasopneumatic Pressure  Low    Vasopneumatic Temperature   34      Manual Therapy   Manual Therapy  Passive ROM    Joint Mobilization  II-III AP and inferior joint mobilizations to improve mobility    Passive ROM  PROM of L shoulder into flexion, ER with gentle holds at end range and intermittant oscillations required                  PT Long Term Goals - 03/05/20 0742      PT LONG TERM GOAL #1   Title  Patient will be independent with HEP    Time  6    Period  Weeks    Status  Achieved      PT LONG TERM GOAL #2   Title  Patient will demonstrate 140+ degrees of left shoulder flexion AROM to improve ability to perform overhead tasks.    Time  6    Period  Weeks    Status  On-going      PT LONG TERM GOAL #3   Title  Patient will demonstrate 55+ degrees of left shoulder ER AROM to improve donning and doffing apparel.    Time  6    Period  Weeks    Status  Achieved      PT LONG TERM GOAL #4   Title  Patient will demonstrate 4/5 or greater left shoulder MMT in all planes to improve stability during functional tasks.    Time  6    Period  Weeks    Status  On-going      PT LONG TERM GOAL #5   Title  Patient will be  independent with ADLs and with pain less than or equal to 2/10 in left shoulder.    Time  6    Period  Weeks    Status  On-going            Plan - 03/12/20 0813    Clinical Impression Statement  Patient was able to tolerate treatment but with rests secondary to grinding pain. Patient able to perform flexion in lawn chair position with active assist. Smooth arcs of motion with PROM with no reports of increased pain. No adverse affects upon removal of modalities.    Personal Factors and Comorbidities  Age;Comorbidity 1    Comorbidities  left RTC repair 12/10/2019    Examination-Activity Limitations  Bathing;Reach Overhead;Carry;Dressing;Hygiene/Grooming;Lift    Stability/Clinical Decision Making  Stable/Uncomplicated    Clinical Decision Making  Low    Rehab Potential  Excellent    PT Frequency  3x / week    PT Duration  6 weeks    PT Treatment/Interventions  ADLs/Self Care Home Management;Cryotherapy;Electrical Stimulation;Moist Heat;Ultrasound;Neuromuscular re-education;Therapeutic activities;Therapeutic exercise;Functional mobility training;Patient/family education;Manual techniques;Vasopneumatic Device;Taping;Passive range of motion    PT Next Visit Plan  Continue AAROM and progress to AROM per protocol. 13 weeks post of 03/10/2020.    PT Home Exercise Plan  see patient education section    Consulted and Agree with Plan of Care  Patient       Patient will benefit from skilled therapeutic intervention in order to improve the following deficits and impairments:  Decreased activity tolerance, Decreased strength, Decreased range of motion, Pain, Impaired UE functional use, Postural dysfunction  Visit Diagnosis: Stiffness of left shoulder, not elsewhere classified  Muscle weakness (generalized)     Problem List There are no problems to display for this patient.   Guss Bunde, PT, DPT 03/12/2020, 8:25 AM  [facility] [address] [place], [place], [address] Phone: [phone]   Fax:  [phone]  Name: Alvin Sanchez MRN: [id number] Date of  Birth: [birth year]

## 2020-03-17 ENCOUNTER — Ambulatory Visit: Payer: Worker's Compensation | Admitting: Physical Therapy

## 2020-03-17 ENCOUNTER — Other Ambulatory Visit: Payer: Self-pay

## 2020-03-17 ENCOUNTER — Encounter: Payer: Self-pay | Admitting: Physical Therapy

## 2020-03-17 DIAGNOSIS — M25612 Stiffness of left shoulder, not elsewhere classified: Secondary | ICD-10-CM | POA: Diagnosis not present

## 2020-03-17 DIAGNOSIS — M6281 Muscle weakness (generalized): Secondary | ICD-10-CM

## 2020-03-17 NOTE — Therapy (Signed)
[facility] [address] [place], [place], [address] Phone: [phone]   Fax:  [phone]  Physical Therapy Treatment  Patient Details  Name: Alvin Sanchez MRN: [id number] Date of Birth: 07-03-1956 Referring Provider (PT): Jones Broom, MD   Encounter Date: 03/17/2020  PT End of Session - 03/17/20 0801    Visit Number  23    Number of Visits  36    Date for PT Re-Evaluation  04/08/20    Authorization Type  Worker's compensation    Authorization - Visit Number  23    Authorization - Number of Visits  36    PT Start Time  0730    Activity Tolerance  Patient tolerated treatment well    Behavior During Therapy  [facility] for tasks assessed/performed       Past Medical History:  Diagnosis Date  . Anxiety   . Kidney stones     Past Surgical History:  Procedure Laterality Date  . FRACTURE SURGERY     toe  . ROTATOR CUFF REPAIR Left 12/10/2019  . THYROIDECTOMY Right 02/04/2011   Dr. Sandi Carne, [facility]    There were no vitals filed for this visit.  Subjective Assessment - 03/17/20 0759    Subjective  COVID-19 screening performed upon arrival. Patient reports he is able to reach the bottom shelf at home with greater ease. Patient also states he can raise his arm up with minimal pain but once he performs more than 3 it begins to hurt.    Pertinent History  left RTC repair 12/10/2019    Limitations  Lifting;House hold activities    Patient Stated Goals  use arm normally again.    Currently in Pain?  No/denies         [facility] PT Assessment - 03/17/20 0001      Assessment   Medical Diagnosis  Traumatic rotator cuff tear left; shoulder impingement, left    Referring Provider (PT)  Jones Broom, MD    Onset Date/Surgical Date  12/10/19    Hand Dominance  Right    Next MD Visit  04/2020    Prior Therapy  no      Precautions   Precautions  Shoulder    Precaution Comments  Follow RTC protocol see media                    [facility] Adult PT Treatment/Exercise - 03/17/20 0001      Shoulder Exercises: Supine   Protraction  AROM;Left;20 reps;10 reps    Flexion  Left;AROM;20 reps;10 reps      Shoulder Exercises: Standing   Flexion  Left;20 reps;AROM   cone to bottom shelf     Shoulder Exercises: Pulleys   Flexion  5 minutes      Shoulder Exercises: ROM/Strengthening   UBE (Upper Arm Bike)  120 RPM x8 mins    Ranger  standing ranger flexion, CW, CCW x3 minutes each      Modalities   Modalities  Electrical Stimulation;Vasopneumatic      Electrical Stimulation   Electrical Stimulation Location  L shoulder    Electrical Stimulation Action  pre-mod    Electrical Stimulation Parameters  80-510 hz x10 mins    Electrical Stimulation Goals  Pain;Edema      Vasopneumatic   Number Minutes Vasopneumatic   10 minutes    Vasopnuematic Location   Shoulder    Vasopneumatic Pressure  Low    Vasopneumatic Temperature   34  Manual Therapy   Manual Therapy  Passive ROM    Joint Mobilization  II-III AP and inferior joint mobilizations to improve mobility    Passive ROM  PROM of L shoulder into ABD and horizontal abd with gentle holds at end range and intermittant oscillations required                  PT Long Term Goals - 03/05/20 0742      PT LONG TERM GOAL #1   Title  Patient will be independent with HEP    Time  6    Period  Weeks    Status  Achieved      PT LONG TERM GOAL #2   Title  Patient will demonstrate 140+ degrees of left shoulder flexion AROM to improve ability to perform overhead tasks.    Time  6    Period  Weeks    Status  On-going      PT LONG TERM GOAL #3   Title  Patient will demonstrate 55+ degrees of left shoulder ER AROM to improve donning and doffing apparel.    Time  6    Period  Weeks    Status  Achieved      PT LONG TERM GOAL #4   Title  Patient will demonstrate 4/5 or greater left shoulder MMT in all planes to improve stability during  functional tasks.    Time  6    Period  Weeks    Status  On-going      PT LONG TERM GOAL #5   Title  Patient will be independent with ADLs and with pain less than or equal to 2/10 in left shoulder.    Time  6    Period  Weeks    Status  On-going            Plan - 03/17/20 7824    Clinical Impression Statement  Patient was able to complete treatment with less pain and grinding sensations. Patient attempted supine horizontal abd and supine AAROM abduction but with pain therefore exercise terminated. Patient responded much better with PROM into horizontal ABD and abduction with no crepitus or pain. No adverse affects upon removal of modalities.    Personal Factors and Comorbidities  Age;Comorbidity 1    Comorbidities  left RTC repair 12/10/2019    Examination-Activity Limitations  Bathing;Reach Overhead;Carry;Dressing;Hygiene/Grooming;Lift    Stability/Clinical Decision Making  Stable/Uncomplicated    Clinical Decision Making  Low    Rehab Potential  Excellent    PT Frequency  3x / week    PT Duration  6 weeks    PT Treatment/Interventions  ADLs/Self Care Home Management;Cryotherapy;Electrical Stimulation;Moist Heat;Ultrasound;Neuromuscular re-education;Therapeutic activities;Therapeutic exercise;Functional mobility training;Patient/family education;Manual techniques;Vasopneumatic Device;Taping;Passive range of motion    PT Next Visit Plan  Continue AAROM and progress to AROM per protocol. 13 weeks post of 03/10/2020.    PT Home Exercise Plan  see patient education section    Consulted and Agree with Plan of Care  Patient       Patient will benefit from skilled therapeutic intervention in order to improve the following deficits and impairments:  Decreased activity tolerance, Decreased strength, Decreased range of motion, Pain, Impaired UE functional use, Postural dysfunction  Visit Diagnosis: Stiffness of left shoulder, not elsewhere classified  Muscle weakness  (generalized)     Problem List There are no problems to display for this patient.   [name], PT, DPT 03/17/2020, 8:24 AM   [facility]  [address] [place], [place], [address] Phone: [phone]   Fax:  [phone]  Name: Courvoisier Hamblen MRN: [id number] Date of Birth: Aug 05, 1956

## 2020-03-19 ENCOUNTER — Other Ambulatory Visit: Payer: Self-pay

## 2020-03-19 ENCOUNTER — Encounter: Payer: Self-pay | Admitting: Physical Therapy

## 2020-03-19 ENCOUNTER — Ambulatory Visit: Payer: Worker's Compensation | Admitting: Physical Therapy

## 2020-03-19 DIAGNOSIS — M6281 Muscle weakness (generalized): Secondary | ICD-10-CM

## 2020-03-19 DIAGNOSIS — M25612 Stiffness of left shoulder, not elsewhere classified: Secondary | ICD-10-CM

## 2020-03-19 NOTE — Therapy (Signed)
[facility] [place], [place], [address] Phone: [phone]   Fax:  [phone]  Physical Therapy Treatment  Patient Details  Name: Alvin Sanchez MRN: [id number] Date of Birth: 1956-07-11 Referring Provider (PT): Tania Ade, MD   Encounter Date: 03/19/2020  PT End of Session - 03/19/20 0824    Visit Number  24    Number of Visits  36    Date for PT Re-Evaluation  04/08/20    Authorization Type  Worker's compensation    Authorization - Visit Number  24    Authorization - Number of Visits  51    PT Start Time  0731    PT Stop Time  0823    PT Time Calculation (min)  52 min    Activity Tolerance  Patient tolerated treatment well    Behavior During Therapy  [facility] for tasks assessed/performed       Past Medical History:  Diagnosis Date  . Anxiety   . Kidney stones     Past Surgical History:  Procedure Laterality Date  . FRACTURE SURGERY     toe  . ROTATOR CUFF REPAIR Left 12/10/2019  . THYROIDECTOMY Right 02/04/2011   Dr. Hardie Lora, [facility]    There were no vitals filed for this visit.  Subjective Assessment - 03/19/20 0814    Subjective  COVID-19 screening performed upon arrival. Patient reports grinding pain isn't as intense as before. States UT muscle very tight.    Pertinent History  left RTC repair 12/10/2019    Limitations  Lifting;House hold activities    Patient Stated Goals  use arm normally again.    Currently in Pain?  No/denies   none at rest        [facility] PT Assessment - 03/19/20 0001      Assessment   Medical Diagnosis  Traumatic rotator cuff tear left; shoulder impingement, left    Referring Provider (PT)  Tania Ade, MD    Onset Date/Surgical Date  12/10/19    Hand Dominance  Right    Next MD Visit  04/2020    Prior Therapy  no      Precautions   Precautions  Shoulder    Precaution Comments  Follow RTC protocol see media                   [facility] Adult PT  Treatment/Exercise - 03/19/20 0001      Shoulder Exercises: Supine   Flexion  --      Shoulder Exercises: Standing   Flexion  Left;20 reps;AROM   cone to bottom shelf   Other Standing Exercises  wall ladder x3 mins      Shoulder Exercises: Pulleys   Flexion  5 minutes      Shoulder Exercises: ROM/Strengthening   UBE (Upper Arm Bike)  120 RPM x8 mins    Ranger  standing ranger flexion, CW, CCW x3 minutes each      Shoulder Exercises: Isometric Strengthening   External Rotation  5X10"    Internal Rotation  5X10"      Modalities   Modalities  Electrical Stimulation;Vasopneumatic      Electrical Stimulation   Electrical Stimulation Location  L shoulder    Electrical Stimulation Action  pre-mod    Electrical Stimulation Parameters  80-150 x10 mins    Electrical Stimulation Goals  Pain      Vasopneumatic   Number Minutes Vasopneumatic   10 minutes    Vasopnuematic Location  Shoulder    Vasopneumatic Pressure  Low    Vasopneumatic Temperature   34                  PT Long Term Goals - 03/05/20 0742      PT LONG TERM GOAL #1   Title  Patient will be independent with HEP    Time  6    Period  Weeks    Status  Achieved      PT LONG TERM GOAL #2   Title  Patient will demonstrate 140+ degrees of left shoulder flexion AROM to improve ability to perform overhead tasks.    Time  6    Period  Weeks    Status  On-going      PT LONG TERM GOAL #3   Title  Patient will demonstrate 55+ degrees of left shoulder ER AROM to improve donning and doffing apparel.    Time  6    Period  Weeks    Status  Achieved      PT LONG TERM GOAL #4   Title  Patient will demonstrate 4/5 or greater left shoulder MMT in all planes to improve stability during functional tasks.    Time  6    Period  Weeks    Status  On-going      PT LONG TERM GOAL #5   Title  Patient will be independent with ADLs and with pain less than or equal to 2/10 in left shoulder.    Time  6    Period  Weeks     Status  On-going            Plan - 03/19/20 0815    Clinical Impression Statement  Patient responded fairly well to therapy session. Patient demonstrated improved left shoulder flexion to bottom shelf but with UT compensatory motions noted particularly during last few reps. Patient unable to perform to middle shelf without significant compensatory motions and use of heel raise to reach shelf. Exercise terminated. Patient instructed on UT STW/M and TPR to help decrease tightness.    Personal Factors and Comorbidities  Age;Comorbidity 1    Comorbidities  left RTC repair 12/10/2019    Examination-Activity Limitations  Bathing;Reach Overhead;Carry;Dressing;Hygiene/Grooming;Lift    Stability/Clinical Decision Making  Stable/Uncomplicated    Clinical Decision Making  Low    Rehab Potential  Excellent    PT Frequency  3x / week    PT Duration  6 weeks    PT Treatment/Interventions  ADLs/Self Care Home Management;Cryotherapy;Electrical Stimulation;Moist Heat;Ultrasound;Neuromuscular re-education;Therapeutic activities;Therapeutic exercise;Functional mobility training;Patient/family education;Manual techniques;Vasopneumatic Device;Taping;Passive range of motion    PT Next Visit Plan  Continue AAROM and progress to AROM per protocol. 13 weeks post of 03/10/2020.    PT Home Exercise Plan  see patient education section    Consulted and Agree with Plan of Care  Patient       Patient will benefit from skilled therapeutic intervention in order to improve the following deficits and impairments:  Decreased activity tolerance, Decreased strength, Decreased range of motion, Pain, Impaired UE functional use, Postural dysfunction  Visit Diagnosis: Stiffness of left shoulder, not elsewhere classified  Muscle weakness (generalized)     Problem List There are no problems to display for this patient.   Guss Bunde, PT, DPT 03/19/2020, 8:25 AM  [facility] [address] [place], [place], [address] Phone: [phone]   Fax:  [phone]  Name: Alvin Sanchez MRN: [id number] Date of Birth:  03/11/1956   

## 2020-03-24 ENCOUNTER — Other Ambulatory Visit: Payer: Self-pay

## 2020-03-24 ENCOUNTER — Encounter: Payer: Self-pay | Admitting: Physical Therapy

## 2020-03-24 ENCOUNTER — Ambulatory Visit: Payer: Worker's Compensation | Admitting: Physical Therapy

## 2020-03-24 DIAGNOSIS — M6281 Muscle weakness (generalized): Secondary | ICD-10-CM

## 2020-03-24 DIAGNOSIS — M25612 Stiffness of left shoulder, not elsewhere classified: Secondary | ICD-10-CM | POA: Diagnosis not present

## 2020-03-24 NOTE — Therapy (Signed)
[facility] [address] [place], [place], [address] Phone: [phone]   Fax:  [phone]  Physical Therapy Treatment  Patient Details  Name: Alvin Sanchez MRN: [id number] Date of Birth: 09/24/1956 Referring Provider (PT): Jones Broom, MD   Encounter Date: 03/24/2020  PT End of Session - 03/24/20 0743    Visit Number  25    Number of Visits  36    Date for PT Re-Evaluation  04/08/20    Authorization Type  Worker's compensation    Authorization - Visit Number  25    Authorization - Number of Visits  36    PT Start Time  0731    PT Stop Time  0820    PT Time Calculation (min)  49 min    Activity Tolerance  Patient tolerated treatment well    Behavior During Therapy  [facility] for tasks assessed/performed       Past Medical History:  Diagnosis Date  . Anxiety   . Kidney stones     Past Surgical History:  Procedure Laterality Date  . FRACTURE SURGERY     toe  . ROTATOR CUFF REPAIR Left 12/10/2019  . THYROIDECTOMY Right 02/04/2011   Dr. Sandi Carne, [facility]    There were no vitals filed for this visit.  Subjective Assessment - 03/24/20 0742    Subjective  COVID-19 screening performed upon arrival. Patient reports improvements with motion with wall circles but still has grinding pains. Would like to ask the surgeon for more PT visits rather than go for a shoulder replacement.    Pertinent History  left RTC repair 12/10/2019    Limitations  Lifting;House hold activities    Patient Stated Goals  use arm normally again.    Currently in Pain?  No/denies         [facility] PT Assessment - 03/24/20 0001      Assessment   Medical Diagnosis  Traumatic rotator cuff tear left; shoulder impingement, left    Referring Provider (PT)  Jones Broom, MD    Onset Date/Surgical Date  12/10/19    Hand Dominance  Right    Next MD Visit  04/2020    Prior Therapy  no      Precautions   Precautions  Shoulder    Precaution Comments   Follow RTC protocol see media                   [facility] Adult PT Treatment/Exercise - 03/24/20 0001      Shoulder Exercises: Prone   Retraction  AROM;Left;20 reps    Retraction Weight (lbs)  1    Extension  AROM;Left;20 reps    Extension Weight (lbs)  1      Shoulder Exercises: Standing   Flexion  20 reps;AROM;Both    Shoulder Flexion Weight (lbs)  2    Flexion Limitations  Both hand holding 2# weight    ABduction  AROM;20 reps    ABduction Limitations  scaption    Other Standing Exercises  bicep curls 2x30, tricep extension 2x30      Shoulder Exercises: Pulleys   Flexion  5 minutes      Shoulder Exercises: ROM/Strengthening   UBE (Upper Arm Bike)  120 RPM x8 mins      Modalities   Modalities  Electrical Stimulation;Vasopneumatic      Electrical Stimulation   Electrical Stimulation Location  L shoulder    Electrical Stimulation Action  pre-mod    Electrical Stimulation Parameters  80-150 hz x10 mins    Electrical Stimulation Goals  Pain      Vasopneumatic   Number Minutes Vasopneumatic   10 minutes    Vasopnuematic Location   Shoulder    Vasopneumatic Pressure  Low    Vasopneumatic Temperature   34                  PT Long Term Goals - 03/05/20 0742      PT LONG TERM GOAL #1   Title  Patient will be independent with HEP    Time  6    Period  Weeks    Status  Achieved      PT LONG TERM GOAL #2   Title  Patient will demonstrate 140+ degrees of left shoulder flexion AROM to improve ability to perform overhead tasks.    Time  6    Period  Weeks    Status  On-going      PT LONG TERM GOAL #3   Title  Patient will demonstrate 55+ degrees of left shoulder ER AROM to improve donning and doffing apparel.    Time  6    Period  Weeks    Status  Achieved      PT LONG TERM GOAL #4   Title  Patient will demonstrate 4/5 or greater left shoulder MMT in all planes to improve stability during functional tasks.    Time  6    Period  Weeks    Status   On-going      PT LONG TERM GOAL #5   Title  Patient will be independent with ADLs and with pain less than or equal to 2/10 in left shoulder.    Time  6    Period  Weeks    Status  On-going            Plan - 03/24/20 0744    Clinical Impression Statement  Patient responded fairly well with minimal reports pain. Patient's TEs emphasized on low level strengthening to which patient was able to demonstrate good form. Patient and PT discussed strengthening muscles surrounding shoulder but still with ROM to maintain range. Patient reported understanding. Normal response to modalities upon removal.    Personal Factors and Comorbidities  Age;Comorbidity 1    Comorbidities  left RTC repair 12/10/2019 (15 weeks post op 03/24/2020)    Examination-Activity Limitations  Bathing;Reach Overhead;Carry;Dressing;Hygiene/Grooming;Lift    Stability/Clinical Decision Making  Stable/Uncomplicated    Clinical Decision Making  Low    Rehab Potential  Excellent    PT Frequency  3x / week    PT Duration  6 weeks    PT Treatment/Interventions  ADLs/Self Care Home Management;Cryotherapy;Electrical Stimulation;Moist Heat;Ultrasound;Neuromuscular re-education;Therapeutic activities;Therapeutic exercise;Functional mobility training;Patient/family education;Manual techniques;Vasopneumatic Device;Taping;Passive range of motion    PT Next Visit Plan  Continue AAROM and progress to AROM per protocol. 15 weeks post op 03/24/2020.    PT Home Exercise Plan  see patient education section    Consulted and Agree with Plan of Care  Patient       Patient will benefit from skilled therapeutic intervention in order to improve the following deficits and impairments:  Decreased activity tolerance, Decreased strength, Decreased range of motion, Pain, Impaired UE functional use, Postural dysfunction  Visit Diagnosis: Stiffness of left shoulder, not elsewhere classified  Muscle weakness (generalized)     Problem List There are  no problems to display for this patient.   Guss Bunde, PT, DPT 03/24/2020, 8:32 AM  [facility] [place], [place], [address] Phone: [phone]   Fax:  [phone]  Name: Mylz Yuan MRN: [id number] Date of Birth: 05/13/1956

## 2020-03-26 ENCOUNTER — Ambulatory Visit: Payer: Worker's Compensation | Admitting: Physical Therapy

## 2020-03-26 ENCOUNTER — Encounter: Payer: Self-pay | Admitting: Physical Therapy

## 2020-03-26 ENCOUNTER — Other Ambulatory Visit: Payer: Self-pay

## 2020-03-26 DIAGNOSIS — M6281 Muscle weakness (generalized): Secondary | ICD-10-CM

## 2020-03-26 DIAGNOSIS — M25612 Stiffness of left shoulder, not elsewhere classified: Secondary | ICD-10-CM

## 2020-03-26 NOTE — Therapy (Signed)
[facility] [address] [place], [place], [address] Phone: [phone]   Fax:  [phone]  Physical Therapy Treatment  Patient Details  Name: Alvin Sanchez MRN: [id number] Date of Birth: [birth year] Referring Provider (PT): Jones Broom, MD   Encounter Date: 03/26/2020  PT End of Session - 03/26/20 0752    Visit Number  26    Number of Visits  36    Date for PT Re-Evaluation  04/08/20    Authorization Type  Worker's compensation    Authorization - Visit Number  26    Authorization - Number of Visits  36    PT Start Time  0731    PT Stop Time  0826    PT Time Calculation (min)  55 min    Activity Tolerance  Patient tolerated treatment well    Behavior During Therapy  [facility] for tasks assessed/performed       Past Medical History:  Diagnosis Date  . Anxiety   . Kidney stones     Past Surgical History:  Procedure Laterality Date  . FRACTURE SURGERY     toe  . ROTATOR CUFF REPAIR Left 12/10/2019  . THYROIDECTOMY Right 02/04/2011   Dr. Sandi Carne, [facility]    There were no vitals filed for this visit.  Subjective Assessment - 03/26/20 0751    Subjective  COVID-19 screening performed upon arrival. Reports his MD appointment was changed to 04/06/2020 and his case manager said he is likely to continue therapy.    Pertinent History  left RTC repair 12/10/2019    Limitations  Lifting;House hold activities    Patient Stated Goals  use arm normally again.    Currently in Pain?  No/denies   no pain at rest        [facility] PT Assessment - 03/26/20 0001      Assessment   Medical Diagnosis  Traumatic rotator cuff tear left; shoulder impingement, left    Referring Provider (PT)  Jones Broom, MD    Onset Date/Surgical Date  12/10/19    Hand Dominance  Right    Next MD Visit  04/2020    Prior Therapy  no      Precautions   Precautions  Shoulder    Precaution Comments  Follow RTC protocol see media                    [facility] Adult PT Treatment/Exercise - 03/26/20 0001      Shoulder Exercises: Prone   Retraction  AROM;Left;20 reps    Retraction Weight (lbs)  2    Extension  AROM;Left;20 reps    Extension Weight (lbs)  2      Shoulder Exercises: Standing   Flexion  20 reps;AROM;Both    Shoulder Flexion Weight (lbs)  2    Flexion Limitations  Both hand holding 2# weight, bottom and middle shelf x20 each      Shoulder Exercises: Pulleys   Flexion  5 minutes      Shoulder Exercises: ROM/Strengthening   UBE (Upper Arm Bike)  90 RPM x8 mins    Ranger  standing ranger flexion, CW, CCW x3 minutes each      Modalities   Modalities  Electrical Stimulation;Vasopneumatic      Electrical Stimulation   Electrical Stimulation Location  L shoulder    Electrical Stimulation Action  pre-mod    Electrical Stimulation Parameters  80-150 hz x10 mins    Electrical Stimulation Goals  Pain  Vasopneumatic   Number Minutes Vasopneumatic   10 minutes    Vasopnuematic Location   Shoulder    Vasopneumatic Pressure  Low    Vasopneumatic Temperature   34                  PT Long Term Goals - 03/05/20 0742      PT LONG TERM GOAL #1   Title  Patient will be independent with HEP    Time  6    Period  Weeks    Status  Achieved      PT LONG TERM GOAL #2   Title  Patient will demonstrate 140+ degrees of left shoulder flexion AROM to improve ability to perform overhead tasks.    Time  6    Period  Weeks    Status  On-going      PT LONG TERM GOAL #3   Title  Patient will demonstrate 55+ degrees of left shoulder ER AROM to improve donning and doffing apparel.    Time  6    Period  Weeks    Status  Achieved      PT LONG TERM GOAL #4   Title  Patient will demonstrate 4/5 or greater left shoulder MMT in all planes to improve stability during functional tasks.    Time  6    Period  Weeks    Status  On-going      PT LONG TERM GOAL #5   Title  Patient will be independent  with ADLs and with pain less than or equal to 2/10 in left shoulder.    Time  6    Period  Weeks    Status  On-going            Plan - 03/26/20 0820    Clinical Impression Statement  Patient responded well to therapy session, still with grinding pain but able to complete TEs with minimal rest. Patient noted with pain from 0 to about 90 degrees of flexion and abduction but once he is able to get beyond 90 degrees, he has no reports of pain or grinding. Patient instructed to demonstrate this to MD next follow up visit. No adverse affects upon removal of modalities.    Personal Factors and Comorbidities  Age;Comorbidity 1    Comorbidities  left RTC repair 12/10/2019 (15 weeks post op 03/24/2020)    Examination-Activity Limitations  Bathing;Reach Overhead;Carry;Dressing;Hygiene/Grooming;Lift    Stability/Clinical Decision Making  Stable/Uncomplicated    Clinical Decision Making  Low    Rehab Potential  Excellent    PT Frequency  3x / week    PT Duration  6 weeks    PT Treatment/Interventions  ADLs/Self Care Home Management;Cryotherapy;Electrical Stimulation;Moist Heat;Ultrasound;Neuromuscular re-education;Therapeutic activities;Therapeutic exercise;Functional mobility training;Patient/family education;Manual techniques;Vasopneumatic Device;Taping;Passive range of motion    PT Next Visit Plan  Continue AAROM and progress to AROM per protocol. 15 weeks post op 03/24/2020.    PT Home Exercise Plan  see patient education section    Consulted and Agree with Plan of Care  Patient       Patient will benefit from skilled therapeutic intervention in order to improve the following deficits and impairments:  Decreased activity tolerance, Decreased strength, Decreased range of motion, Pain, Impaired UE functional use, Postural dysfunction  Visit Diagnosis: Stiffness of left shoulder, not elsewhere classified  Muscle weakness (generalized)     Problem List There are no problems to display for this  patient.   [name], PT, DPT 03/26/2020, 8:26  AM  [facility] [address] [place], [place], [address] Phone: [phone]   Fax:  [phone]  Name: Alvin Sanchez MRN: [id number] Date of Birth: 1955-12-07

## 2020-03-31 ENCOUNTER — Encounter: Payer: Self-pay | Admitting: Physical Therapy

## 2020-03-31 ENCOUNTER — Ambulatory Visit: Payer: Worker's Compensation | Admitting: Physical Therapy

## 2020-03-31 ENCOUNTER — Other Ambulatory Visit: Payer: Self-pay

## 2020-03-31 DIAGNOSIS — M6281 Muscle weakness (generalized): Secondary | ICD-10-CM

## 2020-03-31 DIAGNOSIS — M25612 Stiffness of left shoulder, not elsewhere classified: Secondary | ICD-10-CM

## 2020-03-31 NOTE — Therapy (Signed)
[facility] [address] [place], [place], [address] Phone: [phone]   Fax:  [phone]  Physical Therapy Treatment  Patient Details  Name: Alvin Sanchez MRN: [id number] Date of Birth: 14-Jan-1956 Referring Provider (PT): Jones Broom, MD   Encounter Date: 03/31/2020  PT End of Session - 03/31/20 0823    Visit Number  27    Number of Visits  36    Date for PT Re-Evaluation  04/08/20    Authorization Type  Worker's compensation    Authorization - Visit Number  27    Authorization - Number of Visits  36    PT Start Time  0731    PT Stop Time  0824    PT Time Calculation (min)  53 min    Activity Tolerance  Patient tolerated treatment well    Behavior During Therapy  [facility] for tasks assessed/performed       Past Medical History:  Diagnosis Date  . Anxiety   . Kidney stones     Past Surgical History:  Procedure Laterality Date  . FRACTURE SURGERY     toe  . ROTATOR CUFF REPAIR Left 12/10/2019  . THYROIDECTOMY Right 02/04/2011   Dr. Sandi Carne, [facility]    There were no vitals filed for this visit.  Subjective Assessment - 03/31/20 0738    Subjective  COVID-19 screening performed upon arrival. Patient arrives feeling his usual pain in his shoulder doing active activity.    Pertinent History  left RTC repair 12/10/2019    Limitations  Lifting;House hold activities    Patient Stated Goals  use arm normally again.    Currently in Pain?  No/denies   pain between 0 to about 90 degrees        [facility] PT Assessment - 03/31/20 0001      Assessment   Medical Diagnosis  Traumatic rotator cuff tear left; shoulder impingement, left    Referring Provider (PT)  Jones Broom, MD    Onset Date/Surgical Date  12/10/19    Hand Dominance  Right    Next MD Visit  04/06/2020    Prior Therapy  no      Precautions   Precautions  Shoulder    Precaution Comments  Follow RTC protocol see media                    [facility] Adult PT Treatment/Exercise - 03/31/20 0001      Shoulder Exercises: Pulleys   Flexion  5 minutes      Shoulder Exercises: ROM/Strengthening   UBE (Upper Arm Bike)  90 RPM x8 mins    Ranger  standing ranger flexion, CW, CCW x3 minutes each      Electrical Stimulation   Electrical Stimulation Location  L shoulder    Electrical Stimulation Action  pre-mod    Electrical Stimulation Parameters  80-150 hz x10    Electrical Stimulation Goals  Pain      Vasopneumatic   Number Minutes Vasopneumatic   10 minutes    Vasopnuematic Location   Shoulder    Vasopneumatic Pressure  Low    Vasopneumatic Temperature   34      Manual Therapy   Manual Therapy  Passive ROM    Joint Mobilization  II-III AP and inferior joint mobilizations to improve mobility    Passive ROM  PROM of L shoulder into flexion, ER, abd with intermittent oscilations.; PNF D1 D2 patterns against PT resistance; rhythmic stabilization at 45, 90  and 130 degrees                  PT Long Term Goals - 03/05/20 0742      PT LONG TERM GOAL #1   Title  Patient will be independent with HEP    Time  6    Period  Weeks    Status  Achieved      PT LONG TERM GOAL #2   Title  Patient will demonstrate 140+ degrees of left shoulder flexion AROM to improve ability to perform overhead tasks.    Time  6    Period  Weeks    Status  On-going      PT LONG TERM GOAL #3   Title  Patient will demonstrate 55+ degrees of left shoulder ER AROM to improve donning and doffing apparel.    Time  6    Period  Weeks    Status  Achieved      PT LONG TERM GOAL #4   Title  Patient will demonstrate 4/5 or greater left shoulder MMT in all planes to improve stability during functional tasks.    Time  6    Period  Weeks    Status  On-going      PT LONG TERM GOAL #5   Title  Patient will be independent with ADLs and with pain less than or equal to 2/10 in left shoulder.    Time  6    Period  Weeks     Status  On-going            Plan - 03/31/20 1601    Clinical Impression Statement  Patient arrives with reports of improvements in functional activiites at home but still with ongoing grind and pop sensations. Patient's PROM is smooth with minimal reports of grinding. Patient has improved stability with rhythmic stabilization. No adverse affects upon removal of modalities.    Personal Factors and Comorbidities  Age;Comorbidity 1    Comorbidities  left RTC repair 12/10/2019 (15 weeks post op 03/24/2020)    Examination-Activity Limitations  Bathing;Reach Overhead;Carry;Dressing;Hygiene/Grooming;Lift    Stability/Clinical Decision Making  Stable/Uncomplicated    Clinical Decision Making  Low    Rehab Potential  Excellent    PT Frequency  3x / week    PT Duration  6 weeks    PT Treatment/Interventions  ADLs/Self Care Home Management;Cryotherapy;Electrical Stimulation;Moist Heat;Ultrasound;Neuromuscular re-education;Therapeutic activities;Therapeutic exercise;Functional mobility training;Patient/family education;Manual techniques;Vasopneumatic Device;Taping;Passive range of motion    PT Next Visit Plan  MD note; Continue AAROM and progress to AROM per protocol. 15 weeks post op 03/24/2020.    PT Home Exercise Plan  see patient education section    Consulted and Agree with Plan of Care  Patient       Patient will benefit from skilled therapeutic intervention in order to improve the following deficits and impairments:  Decreased activity tolerance, Decreased strength, Decreased range of motion, Pain, Impaired UE functional use, Postural dysfunction  Visit Diagnosis: Stiffness of left shoulder, not elsewhere classified  Muscle weakness (generalized)     Problem List There are no problems to display for this patient.   [name] 03/31/2020, 8:27 AM  [facility] [address] [place], [place], [address] Phone: [phone]   Fax:   [phone]  Name: Alvin Sanchez MRN: [id number] Date of Birth: 01-24-1956

## 2020-04-02 ENCOUNTER — Encounter: Payer: Self-pay | Admitting: Physical Therapy

## 2020-04-02 ENCOUNTER — Other Ambulatory Visit: Payer: Self-pay

## 2020-04-02 ENCOUNTER — Ambulatory Visit: Payer: Worker's Compensation | Admitting: Physical Therapy

## 2020-04-02 DIAGNOSIS — M6281 Muscle weakness (generalized): Secondary | ICD-10-CM

## 2020-04-02 DIAGNOSIS — M25612 Stiffness of left shoulder, not elsewhere classified: Secondary | ICD-10-CM | POA: Diagnosis not present

## 2020-04-02 NOTE — Therapy (Signed)
[facility] [place], [place], [address] Phone: [phone]   Fax:  [phone]  Physical Therapy Treatment  Patient Details  Name: Alvin Sanchez MRN: [id number] Date of Birth: Apr 15, 1956 Referring Provider (PT): Tania Ade, MD   Encounter Date: 04/02/2020  PT End of Session - 04/02/20 0752    Visit Number  28    Number of Visits  36    Date for PT Re-Evaluation  04/08/20    Authorization Type  Worker's compensation    Authorization - Visit Number  28    Authorization - Number of Visits  36    PT Start Time  0731    PT Stop Time  0820    PT Time Calculation (min)  49 min    Activity Tolerance  Patient tolerated treatment well    Behavior During Therapy  [facility] for tasks assessed/performed       Past Medical History:  Diagnosis Date  . Anxiety   . Kidney stones     Past Surgical History:  Procedure Laterality Date  . FRACTURE SURGERY     toe  . ROTATOR CUFF REPAIR Left 12/10/2019  . THYROIDECTOMY Right 02/04/2011   Dr. Hardie Lora, [facility]    There were no vitals filed for this visit.  Subjective Assessment - 04/02/20 0749    Subjective  COVID-19 screening performed upon arrival. Patient reports being compliant with HEP but with the usual popping and grinding sensation.    Pertinent History  left RTC repair 12/10/2019    Limitations  Lifting;House hold activities    Patient Stated Goals  use arm normally again.    Currently in Pain?  No/denies   0/10 at rest        [facility] PT Assessment - 04/02/20 0001      Assessment   Medical Diagnosis  Traumatic rotator cuff tear left; shoulder impingement, left    Referring Provider (PT)  Tania Ade, MD    Onset Date/Surgical Date  12/10/19    Hand Dominance  Right    Next MD Visit  04/06/2020    Prior Therapy  no      Precautions   Precautions  Shoulder    Precaution Comments  Follow RTC protocol see media      AROM   Left Shoulder Flexion  80  Degrees   in standing; 165 in supine   Left Shoulder ABduction  41 Degrees   in standing with UT compensation; 80 in supine   Left Shoulder External Rotation  60 Degrees   in standing; 80 in supine     PROM   Overall PROM   Within functional limits for tasks performed                   [facility] Adult PT Treatment/Exercise - 04/02/20 0001      Shoulder Exercises: Prone   Retraction  --    Retraction Weight (lbs)  --    Extension  --    Extension Weight (lbs)  --      Shoulder Exercises: Standing   External Rotation  Strengthening;15 reps    Theraband Level (Shoulder External Rotation)  Level 1 (Yellow)    External Rotation Limitations  step outs    Internal Rotation  Strengthening;Left;15 reps;Theraband    Theraband Level (Shoulder Internal Rotation)  Level 2 (Red)    Row  Strengthening;Left;20 reps;Theraband    Theraband Level (Shoulder Row)  Level 2 (Red)  Shoulder Exercises: Pulleys   Flexion  5 minutes      Shoulder Exercises: ROM/Strengthening   UBE (Upper Arm Bike)  90 RPM x8 mins    Ranger  standing ranger flexion, CW, CCW x3 minutes each      Modalities   Modalities  Electrical Stimulation;Vasopneumatic      Programme researcher, broadcasting/film/video Location  L shoulder    Electrical Stimulation Action  pre-mod    Electrical Stimulation Parameters  80-150 hz x10 mins    Electrical Stimulation Goals  Pain      Vasopneumatic   Number Minutes Vasopneumatic   10 minutes    Vasopnuematic Location   Shoulder    Vasopneumatic Pressure  Low    Vasopneumatic Temperature   34                  PT Long Term Goals - 04/02/20 0830      PT LONG TERM GOAL #1   Title  Patient will be independent with HEP    Time  6    Period  Weeks    Status  Achieved      PT LONG TERM GOAL #2   Title  Patient will demonstrate 140+ degrees of left shoulder flexion AROM to improve ability to perform overhead tasks.    Time  6    Period  Weeks     Status  On-going   achieved in supine     PT LONG TERM GOAL #3   Title  Patient will demonstrate 55+ degrees of left shoulder ER AROM to improve donning and doffing apparel.    Time  6    Period  Weeks    Status  Achieved      PT LONG TERM GOAL #4   Title  Patient will demonstrate 4/5 or greater left shoulder MMT in all planes to improve stability during functional tasks.    Time  6    Period  Weeks    Status  On-going      PT LONG TERM GOAL #5   Title  Patient will be independent with ADLs and with pain less than or equal to 2/10 in left shoulder.    Time  6    Period  Weeks    Status  On-going   dependent on motion           Plan - 04/02/20 0810    Clinical Impression Statement  Patient responded fairly well but with fatigue with TEs. Patient unable to perform left shoulder AROM without pain and UT compensatory motions in standing. Patient able to complete AROM in supine with minimal pain with exception of ABD. Theraband TEs initiated, verbal and tactile cuing provided to prevent UT compensation. Improved form noted for remaining reps. No adverse affects upon removal of modalities.    Personal Factors and Comorbidities  Age;Comorbidity 1    Comorbidities  left RTC repair 12/10/2019 (15 weeks post op 03/24/2020)    Examination-Activity Limitations  Bathing;Reach Overhead;Carry;Dressing;Hygiene/Grooming;Lift    Stability/Clinical Decision Making  Stable/Uncomplicated    Clinical Decision Making  Low    Rehab Potential  Excellent    PT Frequency  3x / week    PT Duration  6 weeks    PT Treatment/Interventions  ADLs/Self Care Home Management;Cryotherapy;Electrical Stimulation;Moist Heat;Ultrasound;Neuromuscular re-education;Therapeutic activities;Therapeutic exercise;Functional mobility training;Patient/family education;Manual techniques;Vasopneumatic Device;Taping;Passive range of motion    PT Next Visit Plan  Continue AAROM and progress to AROM and scapular strengthening per  protocol    PT Home Exercise Plan  see patient education section    Consulted and Agree with Plan of Care  Patient       Patient will benefit from skilled therapeutic intervention in order to improve the following deficits and impairments:  Decreased activity tolerance, Decreased strength, Decreased range of motion, Pain, Impaired UE functional use, Postural dysfunction  Visit Diagnosis: Stiffness of left shoulder, not elsewhere classified  Muscle weakness (generalized)     Problem List There are no problems to display for this patient.   Guss Bunde, PT, DPT 04/02/2020, 8:42 AM  [facility] [address] [place], [place], [address] Phone: [phone]   Fax:  [phone]  Name: Alvin Sanchez MRN: [id number] Date of Birth: November 02, 1956

## 2020-04-07 ENCOUNTER — Encounter: Payer: Self-pay | Admitting: Physical Therapy

## 2020-04-07 ENCOUNTER — Other Ambulatory Visit: Payer: Self-pay

## 2020-04-07 ENCOUNTER — Ambulatory Visit: Payer: Worker's Compensation | Attending: Orthopedic Surgery | Admitting: Physical Therapy

## 2020-04-07 DIAGNOSIS — M6281 Muscle weakness (generalized): Secondary | ICD-10-CM

## 2020-04-07 DIAGNOSIS — M25612 Stiffness of left shoulder, not elsewhere classified: Secondary | ICD-10-CM | POA: Diagnosis present

## 2020-04-07 NOTE — Therapy (Signed)
[facility] [place], [place], [address] Phone: [phone]   Fax:  [phone]  Physical Therapy Treatment  Patient Details  Name: Alvin Sanchez MRN: [id number] Date of Birth: 1956-04-06 Referring Provider (PT): Tania Ade, MD   Encounter Date: 04/07/2020  PT End of Session - 04/07/20 0750    Visit Number  29    Number of Visits  42    Date for PT Re-Evaluation  05/22/20    Authorization Type  Worker's compensation    Authorization - Visit Number  74    Authorization - Number of Visits  42    PT Start Time  0730    PT Stop Time  0824    PT Time Calculation (min)  54 min    Equipment Utilized During Treatment  Other (comment)    Activity Tolerance  Patient tolerated treatment well    Behavior During Therapy  [facility] for tasks assessed/performed       Past Medical History:  Diagnosis Date  . Anxiety   . Kidney stones     Past Surgical History:  Procedure Laterality Date  . FRACTURE SURGERY     toe  . ROTATOR CUFF REPAIR Left 12/10/2019  . THYROIDECTOMY Right 02/04/2011   Dr. Hardie Lora, [facility]    There were no vitals filed for this visit.  Subjective Assessment - 04/07/20 0745    Subjective  COVID-19 screening performed upon arrival. Patient reports MD is sending him for 6 more weeks of therapy.    Pertinent History  left RTC repair 12/10/2019    Limitations  Lifting;House hold activities    Currently in Pain?  No/denies         [facility] PT Assessment - 04/07/20 0001      Assessment   Medical Diagnosis  Traumatic rotator cuff tear left; shoulder impingement, left    Referring Provider (PT)  Tania Ade, MD    Onset Date/Surgical Date  12/10/19    Hand Dominance  Right    Next MD Visit  05/19/2020    Prior Therapy  no      Precautions   Precautions  Shoulder    Precaution Comments  Follow RTC protocol see media                   [facility] Adult PT Treatment/Exercise - 04/07/20  0001      Shoulder Exercises: Prone   Retraction  AROM;Left;20 reps;10 reps    Retraction Weight (lbs)  2    Extension  AROM;Left;20 reps;10 reps    Extension Weight (lbs)  2      Shoulder Exercises: Standing   External Rotation  Strengthening;20 reps    Theraband Level (Shoulder External Rotation)  Level 1 (Yellow)    External Rotation Limitations  step outs    Flexion  20 reps;AROM;Both    Shoulder Flexion Weight (lbs)  2    Flexion Limitations  Both hand holding 2# weight, bottom and middle shelf x20 each      Shoulder Exercises: Pulleys   Flexion  5 minutes      Shoulder Exercises: ROM/Strengthening   UBE (Upper Arm Bike)  60 RPM x8 mins    Ranger  standing ranger flexion, CW, CCW x3 minutes each      Vasopneumatic   Number Minutes Vasopneumatic   10 minutes    Vasopnuematic Location   Shoulder    Vasopneumatic Pressure  Low    Vasopneumatic Temperature   34  PT Long Term Goals - 04/02/20 0830      PT LONG TERM GOAL #1   Title  Patient will be independent with HEP    Time  6    Period  Weeks    Status  Achieved      PT LONG TERM GOAL #2   Title  Patient will demonstrate 140+ degrees of left shoulder flexion AROM to improve ability to perform overhead tasks.    Time  6    Period  Weeks    Status  On-going   achieved in supine     PT LONG TERM GOAL #3   Title  Patient will demonstrate 55+ degrees of left shoulder ER AROM to improve donning and doffing apparel.    Time  6    Period  Weeks    Status  Achieved      PT LONG TERM GOAL #4   Title  Patient will demonstrate 4/5 or greater left shoulder MMT in all planes to improve stability during functional tasks.    Time  6    Period  Weeks    Status  On-going      PT LONG TERM GOAL #5   Title  Patient will be independent with ADLs and with pain less than or equal to 2/10 in left shoulder.    Time  6    Period  Weeks    Status  On-going   dependent on motion           Plan  - 04/07/20 0824    Clinical Impression Statement  Patient responded fairly well to therapy session. Patient was able to perform with improved form but with ongoing grinding. Patient was able to perform step ups with improved form. Minimal pain throughout session. Vasopneumatice device noted with normal response.    Personal Factors and Comorbidities  Age;Comorbidity 1    Comorbidities  left RTC repair 12/10/2019 (15 weeks post op 03/24/2020)    Examination-Activity Limitations  Bathing;Reach Overhead;Carry;Dressing;Hygiene/Grooming;Lift    Stability/Clinical Decision Making  Stable/Uncomplicated    Clinical Decision Making  Low    Rehab Potential  Excellent    PT Frequency  3x / week    PT Duration  6 weeks    PT Treatment/Interventions  ADLs/Self Care Home Management;Cryotherapy;Electrical Stimulation;Moist Heat;Ultrasound;Neuromuscular re-education;Therapeutic activities;Therapeutic exercise;Functional mobility training;Patient/family education;Manual techniques;Vasopneumatic Device;Taping;Passive range of motion    PT Next Visit Plan  Continue AAROM and progress to AROM and scapular strengthening per protocol    PT Home Exercise Plan  see patient education section    Consulted and Agree with Plan of Care  Patient       Patient will benefit from skilled therapeutic intervention in order to improve the following deficits and impairments:  Decreased activity tolerance, Decreased strength, Decreased range of motion, Pain, Impaired UE functional use, Postural dysfunction  Visit Diagnosis: Stiffness of left shoulder, not elsewhere classified  Muscle weakness (generalized)     Problem List There are no problems to display for this patient.   Guss Bunde, PT, DPT 04/07/2020, 10:20 AM  [facility] [address] [place], [place], [address] Phone: [phone]   Fax:  [phone]  Name: Dickson Kostelnik MRN: [id number] Date of Birth:  1956/10/04

## 2020-04-09 ENCOUNTER — Ambulatory Visit: Payer: Worker's Compensation | Admitting: Physical Therapy

## 2020-04-09 ENCOUNTER — Other Ambulatory Visit: Payer: Self-pay

## 2020-04-09 ENCOUNTER — Encounter: Payer: Self-pay | Admitting: Physical Therapy

## 2020-04-09 DIAGNOSIS — M25612 Stiffness of left shoulder, not elsewhere classified: Secondary | ICD-10-CM

## 2020-04-09 DIAGNOSIS — M6281 Muscle weakness (generalized): Secondary | ICD-10-CM

## 2020-04-09 NOTE — Therapy (Signed)
[facility] [place], [place], [address] Phone: [phone]   Fax:  [phone]  Physical Therapy Treatment Progress Note Reporting Period 03/10/2020 to 04/09/2020  See note below for Objective Data and Assessment of Progress/Goals. Patient still with ROM deficits secondary to pain and limited strength but reports he is able to perform more reps before pain and grinding pain.    Patient Details  Name: Alvin Sanchez MRN: [id number] Date of Birth: 1956-03-23 Referring Provider (PT): Tania Ade, MD   Encounter Date: 04/09/2020  PT End of Session - 04/09/20 0742    Visit Number  30    Number of Visits  42    Date for PT Re-Evaluation  05/22/20    Authorization Type  Worker's compensation 75 approved visits    Authorization - Visit Number  79    Authorization - Number of Visits  60    PT Start Time  0731    PT Stop Time  0819    PT Time Calculation (min)  48 min    Activity Tolerance  Patient tolerated treatment well    Behavior During Therapy  [facility] for tasks assessed/performed       Past Medical History:  Diagnosis Date  . Anxiety   . Kidney stones     Past Surgical History:  Procedure Laterality Date  . FRACTURE SURGERY     toe  . ROTATOR CUFF REPAIR Left 12/10/2019  . THYROIDECTOMY Right 02/04/2011   Dr. Hardie Lora, [facility]    There were no vitals filed for this visit.  Subjective Assessment - 04/09/20 0741    Subjective  COVID-19 screening performed upon arrival. Patient reports more soreness and feels "different" states he is unsure if its from the exercises or not doing e-stim.    Pertinent History  left RTC repair 12/10/2019    Limitations  Lifting;House hold activities    Patient Stated Goals  use arm normally again.    Currently in Pain?  Yes   did not provide number on pain scale        [facility] PT Assessment - 04/09/20 0001      Assessment   Medical Diagnosis  Traumatic rotator cuff tear  left; shoulder impingement, left    Referring Provider (PT)  Tania Ade, MD    Onset Date/Surgical Date  12/10/19    Hand Dominance  Right    Next MD Visit  05/19/2020    Prior Therapy  no      Precautions   Precautions  Shoulder    Precaution Comments  Follow RTC protocol see media      AROM   Left Shoulder Flexion  80 Degrees    Left Shoulder ABduction  58 Degrees    Left Shoulder Internal Rotation  --   T11   Left Shoulder External Rotation  62 Degrees   to C1                  [facility] Adult PT Treatment/Exercise - 04/09/20 0001      Shoulder Exercises: Prone   Retraction  AROM;Left;20 reps;10 reps    Retraction Weight (lbs)  2    Flexion  AROM;Left;20 reps   x20 with 1# weight, x6 with 2#    Extension  AROM;Left;20 reps;10 reps    Extension Weight (lbs)  2      Shoulder Exercises: Standing   External Rotation  Strengthening;20 reps    Theraband Level (Shoulder External Rotation)  Level  1 (Yellow)    External Rotation Limitations  step outs    Internal Rotation  Strengthening;Left;Theraband;20 reps    Theraband Level (Shoulder Internal Rotation)  Level 2 (Red)    Flexion  20 reps;AROM;Both    Shoulder Flexion Weight (lbs)  2    Flexion Limitations  Both hand holding 2# weight, to middle shelf x20 each    Row  Strengthening;Left;20 reps;Theraband    Theraband Level (Shoulder Row)  Level 2 (Red)      Shoulder Exercises: Pulleys   Flexion  5 minutes      Shoulder Exercises: ROM/Strengthening   UBE (Upper Arm Bike)  60 RPM x8 mins    Ranger  --      Modalities   Modalities  Programmer, applications Location  L shoulder    Electrical Stimulation Action  pre-mod    Electrical Stimulation Parameters  80-150 hz x10 mins    Electrical Stimulation Goals  Pain      Vasopneumatic   Number Minutes Vasopneumatic   10 minutes    Vasopnuematic Location   Shoulder    Vasopneumatic Pressure   Low    Vasopneumatic Temperature   34      Manual Therapy   Manual Therapy  --    Passive ROM  --                  PT Long Term Goals - 04/09/20 0801      PT LONG TERM GOAL #1   Title  Patient will be independent with HEP    Time  6    Period  Weeks    Status  Achieved      PT LONG TERM GOAL #2   Title  Patient will demonstrate 140+ degrees of left shoulder flexion AROM to improve ability to perform overhead tasks.    Time  6    Period  Weeks    Status  On-going      PT LONG TERM GOAL #3   Title  Patient will demonstrate 55+ degrees of left shoulder ER AROM to improve donning and doffing apparel.    Time  6    Period  Weeks    Status  Achieved      PT LONG TERM GOAL #4   Title  Patient will demonstrate 4/5 or greater left shoulder MMT in all planes to improve stability during functional tasks.    Time  6    Period  Weeks    Status  On-going      PT LONG TERM GOAL #5   Title  Patient will be independent with ADLs and with pain less than or equal to 2/10 in left shoulder.    Time  6    Period  Weeks    Status  On-going            Plan - 04/09/20 0744    Clinical Impression Statement  Patient was able to tolerate treatment well but with more fatigue. Patient observed taking more rest breaks throughout session and is able to recognize when to rest secondary to compensation.    Personal Factors and Comorbidities  Age;Comorbidity 1    Comorbidities  left RTC repair 12/10/2019 (15 weeks post op 03/24/2020)    Examination-Activity Limitations  Bathing;Reach Overhead;Carry;Dressing;Hygiene/Grooming;Lift    Stability/Clinical Decision Making  Stable/Uncomplicated    Clinical Decision Making  Low    Rehab Potential  Excellent  PT Frequency  3x / week    PT Duration  6 weeks    PT Treatment/Interventions  ADLs/Self Care Home Management;Cryotherapy;Electrical Stimulation;Moist Heat;Ultrasound;Neuromuscular re-education;Therapeutic activities;Therapeutic  exercise;Functional mobility training;Patient/family education;Manual techniques;Vasopneumatic Device;Taping;Passive range of motion    PT Next Visit Plan  Continue AAROM and progress to AROM and scapular strengthening per protocol    PT Home Exercise Plan  see patient education section    Consulted and Agree with Plan of Care  Patient       Patient will benefit from skilled therapeutic intervention in order to improve the following deficits and impairments:  Decreased activity tolerance, Decreased strength, Decreased range of motion, Pain, Impaired UE functional use, Postural dysfunction  Visit Diagnosis: Stiffness of left shoulder, not elsewhere classified  Muscle weakness (generalized)     Problem List There are no problems to display for this patient.   Guss Bunde 04/09/2020, 8:23 AM  [facility] [address] [place], [place], [address] Phone: [phone]   Fax:  [phone]  Name: Alvin Sanchez MRN: [id number] Date of Birth: 07/20/1956

## 2020-04-14 ENCOUNTER — Other Ambulatory Visit: Payer: Self-pay

## 2020-04-14 ENCOUNTER — Encounter: Payer: Self-pay | Admitting: Physical Therapy

## 2020-04-14 ENCOUNTER — Ambulatory Visit: Payer: Worker's Compensation | Admitting: Physical Therapy

## 2020-04-14 DIAGNOSIS — M25612 Stiffness of left shoulder, not elsewhere classified: Secondary | ICD-10-CM | POA: Diagnosis not present

## 2020-04-14 DIAGNOSIS — M6281 Muscle weakness (generalized): Secondary | ICD-10-CM

## 2020-04-14 NOTE — Therapy (Signed)
[facility] [address] [place], [place], [address] Phone: [phone]   Fax:  [phone]  Physical Therapy Treatment  Patient Details  Name: Alvin Sanchez MRN: [id number] Date of Birth: May 04, 1956 Referring Provider (PT): Jones Broom, MD   Encounter Date: 04/14/2020  PT End of Session - 04/14/20 0838    Visit Number  31    Number of Visits  42    Date for PT Re-Evaluation  05/22/20    Authorization Type  Worker's compensation 42 approved visits    Authorization - Visit Number  31    Authorization - Number of Visits  42    PT Start Time  0730    PT Stop Time  0820    PT Time Calculation (min)  50 min    Activity Tolerance  Patient tolerated treatment well    Behavior During Therapy  [facility] for tasks assessed/performed       Past Medical History:  Diagnosis Date  . Anxiety   . Kidney stones     Past Surgical History:  Procedure Laterality Date  . FRACTURE SURGERY     toe  . ROTATOR CUFF REPAIR Left 12/10/2019  . THYROIDECTOMY Right 02/04/2011   Dr. Sandi Carne, [facility]    There were no vitals filed for this visit.  Subjective Assessment - 04/14/20 0735    Subjective  COVID-19 screening performed upon arrival. Patient reports "its coming along"    Pertinent History  left RTC repair 12/10/2019    Limitations  Lifting;House hold activities    Patient Stated Goals  use arm normally again.    Currently in Pain?  Yes   did not provide number on pain scale        [facility] PT Assessment - 04/14/20 0001      Assessment   Medical Diagnosis  Traumatic rotator cuff tear left; shoulder impingement, left    Referring Provider (PT)  Jones Broom, MD    Onset Date/Surgical Date  12/10/19    Hand Dominance  Right    Next MD Visit  05/19/2020    Prior Therapy  no      Precautions   Precautions  Shoulder    Precaution Comments  Follow RTC protocol see media                   [facility] Adult PT  Treatment/Exercise - 04/14/20 0001      Shoulder Exercises: Prone   Retraction  AROM;Left;20 reps;10 reps    Retraction Weight (lbs)  2    Flexion  AROM;Left;20 reps;10 reps    Flexion Weight (lbs)  1#    Extension  AROM;Left;20 reps;10 reps    Extension Weight (lbs)  2    Horizontal ABduction 1  Strengthening;AROM;Left;20 reps;10 reps    Horizontal ABduction 1 Weight (lbs)  --      Shoulder Exercises: Standing   External Rotation  Strengthening;20 reps    Theraband Level (Shoulder External Rotation)  Level 1 (Yellow)    External Rotation Limitations  step outs    Internal Rotation  Strengthening;Left;Theraband;20 reps    Theraband Level (Shoulder Internal Rotation)  Level 2 (Red)    Extension  Both;20 reps;Strengthening;Theraband    Theraband Level (Shoulder Extension)  Level 2 (Red)    Row  Strengthening;Left;20 reps;Theraband    Theraband Level (Shoulder Row)  Level 2 (Red)    Other Standing Exercises  bicep curls 2x30, tricep extension 2x30 2#      Shoulder  Exercises: Pulleys   Flexion  5 minutes      Shoulder Exercises: ROM/Strengthening   UBE (Upper Arm Bike)  60 RPM x8 mins      Modalities   Modalities  Electrical Stimulation;Vasopneumatic      Electrical Stimulation   Electrical Stimulation Location  L shoulder    Electrical Stimulation Action  pre-mod    Electrical Stimulation Parameters  80-150 hz x10 mins    Electrical Stimulation Goals  Pain      Vasopneumatic   Number Minutes Vasopneumatic   10 minutes    Vasopnuematic Location   Shoulder    Vasopneumatic Pressure  Low    Vasopneumatic Temperature   34                  PT Long Term Goals - 04/09/20 0801      PT LONG TERM GOAL #1   Title  Patient will be independent with HEP    Time  6    Period  Weeks    Status  Achieved      PT LONG TERM GOAL #2   Title  Patient will demonstrate 140+ degrees of left shoulder flexion AROM to improve ability to perform overhead tasks.    Time  6     Period  Weeks    Status  On-going      PT LONG TERM GOAL #3   Title  Patient will demonstrate 55+ degrees of left shoulder ER AROM to improve donning and doffing apparel.    Time  6    Period  Weeks    Status  Achieved      PT LONG TERM GOAL #4   Title  Patient will demonstrate 4/5 or greater left shoulder MMT in all planes to improve stability during functional tasks.    Time  6    Period  Weeks    Status  On-going      PT LONG TERM GOAL #5   Title  Patient will be independent with ADLs and with pain less than or equal to 2/10 in left shoulder.    Time  6    Period  Weeks    Status  On-going            Plan - 04/14/20 0800    Clinical Impression Statement  Patient responded well to therapy session. Patient noted with slight increase of pain with TEs but it was noted he was able to complete all reps without standing rest breaks with exception of prone horizontal abduction. Patient was able to maintain proper posture with TEs without verbal cuing. No adverse affects upon removal of modalities.    Personal Factors and Comorbidities  Age;Comorbidity 1    Comorbidities  left RTC repair 12/10/2019 (15 weeks post op 03/24/2020)    Examination-Activity Limitations  Bathing;Reach Overhead;Carry;Dressing;Hygiene/Grooming;Lift    Stability/Clinical Decision Making  Stable/Uncomplicated    Clinical Decision Making  Low    Rehab Potential  Excellent    PT Frequency  3x / week    PT Duration  6 weeks    PT Treatment/Interventions  ADLs/Self Care Home Management;Cryotherapy;Electrical Stimulation;Moist Heat;Ultrasound;Neuromuscular re-education;Therapeutic activities;Therapeutic exercise;Functional mobility training;Patient/family education;Manual techniques;Vasopneumatic Device;Taping;Passive range of motion    PT Next Visit Plan  Continue AAROM and progress to AROM and scapular strengthening per protocol    PT Home Exercise Plan  see patient education section    Consulted and Agree with  Plan of Care  Patient  Patient will benefit from skilled therapeutic intervention in order to improve the following deficits and impairments:  Decreased activity tolerance, Decreased strength, Decreased range of motion, Pain, Impaired UE functional use, Postural dysfunction  Visit Diagnosis: Stiffness of left shoulder, not elsewhere classified  Muscle weakness (generalized)     Problem List There are no problems to display for this patient.   Guss Bunde, PT, DPT 04/14/2020, 8:38 AM  [facility] [address] [place], [place], [address] Phone: [phone]   Fax:  [phone]  Name: Alvin Sanchez MRN: [id number] Date of Birth: 1956/10/23

## 2020-04-16 ENCOUNTER — Ambulatory Visit: Payer: Worker's Compensation | Admitting: Physical Therapy

## 2020-04-16 ENCOUNTER — Other Ambulatory Visit: Payer: Self-pay

## 2020-04-16 ENCOUNTER — Encounter: Payer: Self-pay | Admitting: Physical Therapy

## 2020-04-16 DIAGNOSIS — M25612 Stiffness of left shoulder, not elsewhere classified: Secondary | ICD-10-CM

## 2020-04-16 DIAGNOSIS — M6281 Muscle weakness (generalized): Secondary | ICD-10-CM

## 2020-04-16 NOTE — Therapy (Signed)
[facility] [address] [place], [place], [address] Phone: [phone]   Fax:  [phone]  Physical Therapy Treatment  Patient Details  Name: Alvin Sanchez MRN: [id number] Date of Birth: January 08, 1956 Referring Provider (PT): Jones Broom, MD   Encounter Date: 04/16/2020  PT End of Session - 04/16/20 0747    Visit Number  32    Number of Visits  42    Date for PT Re-Evaluation  05/22/20    Authorization Type  Worker's compensation 42 approved visits    Authorization - Visit Number  32    Authorization - Number of Visits  42    PT Start Time  0730    PT Stop Time  0822    PT Time Calculation (min)  52 min    Activity Tolerance  Patient tolerated treatment well    Behavior During Therapy  [facility] for tasks assessed/performed       Past Medical History:  Diagnosis Date  . Anxiety   . Kidney stones     Past Surgical History:  Procedure Laterality Date  . FRACTURE SURGERY     toe  . ROTATOR CUFF REPAIR Left 12/10/2019  . THYROIDECTOMY Right 02/04/2011   Dr. Sandi Carne, [facility]    There were no vitals filed for this visit.  Subjective Assessment - 04/16/20 0735    Subjective  COVID-19 screening performed upon arrival. Patient reports feeling a little sore but not too bad.    Pertinent History  left RTC repair 12/10/2019    Limitations  Lifting;House hold activities    Patient Stated Goals  use arm normally again.    Currently in Pain?  Yes   did not provide number on pain scale        [facility] PT Assessment - 04/16/20 0001      Assessment   Medical Diagnosis  Traumatic rotator cuff tear left; shoulder impingement, left    Referring Provider (PT)  Jones Broom, MD    Onset Date/Surgical Date  12/10/19    Hand Dominance  Right    Next MD Visit  05/19/2020    Prior Therapy  no      Precautions   Precautions  Shoulder    Precaution Comments  Follow RTC protocol see media                    [facility]  Adult PT Treatment/Exercise - 04/16/20 0001      Shoulder Exercises: Standing   External Rotation  Strengthening;20 reps    Theraband Level (Shoulder External Rotation)  Level 2 (Red)    External Rotation Limitations  step outs    Internal Rotation  Strengthening;Left;Theraband;20 reps    Theraband Level (Shoulder Internal Rotation)  Level 2 (Red)    Flexion  20 reps;Strengthening;Left;Theraband;10 reps   10 with theraband; 20 without   Theraband Level (Shoulder Flexion)  Level 1 (Yellow)    Extension  Both;20 reps;Strengthening;Theraband    Theraband Level (Shoulder Extension)  Level 2 (Red)    Row  Strengthening;Left;20 reps;Theraband    Theraband Level (Shoulder Row)  Level 2 (Red)      Shoulder Exercises: Pulleys   Flexion  5 minutes      Shoulder Exercises: ROM/Strengthening   UBE (Upper Arm Bike)  60 RPM x8 mins    Ranger  standing ranger flexion, CW, CCW x3 minutes each      Modalities   Modalities  Public librarian  Stimulation   Electrical Stimulation Location  L shoulder    Electrical Stimulation Action  pre-mod    Electrical Stimulation Parameters  80-150 hz x10 mins    Electrical Stimulation Goals  Pain      Vasopneumatic   Number Minutes Vasopneumatic   10 minutes    Vasopnuematic Location   Shoulder    Vasopneumatic Pressure  Low    Vasopneumatic Temperature   34             PT Education - 04/16/20 0824    Education Details  reverse TSA [email or web address]    Person(s) Educated  Patient    Methods  Explanation;Handout    Comprehension  Verbalized understanding          PT Long Term Goals - 04/09/20 0801      PT LONG TERM GOAL #1   Title  Patient will be independent with HEP    Time  6    Period  Weeks    Status  Achieved      PT LONG TERM GOAL #2   Title  Patient will demonstrate 140+ degrees of left  shoulder flexion AROM to improve ability to perform overhead tasks.    Time  6    Period  Weeks    Status  On-going      PT LONG TERM GOAL #3   Title  Patient will demonstrate 55+ degrees of left shoulder ER AROM to improve donning and doffing apparel.    Time  6    Period  Weeks    Status  Achieved      PT LONG TERM GOAL #4   Title  Patient will demonstrate 4/5 or greater left shoulder MMT in all planes to improve stability during functional tasks.    Time  6    Period  Weeks    Status  On-going      PT LONG TERM GOAL #5   Title  Patient will be independent with ADLs and with pain less than or equal to 2/10 in left shoulder.    Time  6    Period  Weeks    Status  On-going            Plan - 04/16/20 0744    Clinical Impression Statement  Patient completed treatment but with short rest breaks secondary to fatigue and grinding sensation. Patient is unsure if weather affects it. Patient noted with improved form and technique with UE ranger with minimal compensatory motions. Patient provided with handout regarding reverse TSA as he is questioning how it would work and if it would improve his function.  Normal response to modalities upon removal    Personal Factors and Comorbidities  Age;Comorbidity 1    Comorbidities  left RTC repair 12/10/2019 (15 weeks post op 03/24/2020)    Examination-Activity Limitations  Bathing;Reach Overhead;Carry;Dressing;Hygiene/Grooming;Lift    Stability/Clinical Decision Making  Stable/Uncomplicated    Clinical Decision Making  Low    Rehab Potential  Excellent    PT Frequency  3x / week    PT Duration  6 weeks    PT Treatment/Interventions  ADLs/Self Care Home Management;Cryotherapy;Electrical Stimulation;Moist Heat;Ultrasound;Neuromuscular re-education;Therapeutic activities;Therapeutic exercise;Functional mobility training;Patient/family education;Manual techniques;Vasopneumatic Device;Taping;Passive range of motion    PT Next Visit Plan  Continue  AAROM and progress to AROM and scapular strengthening per protocol    PT Home Exercise Plan  see patient education section    Consulted and Agree with Plan of Care  Patient  Patient will benefit from skilled therapeutic intervention in order to improve the following deficits and impairments:  Decreased activity tolerance, Decreased strength, Decreased range of motion, Pain, Impaired UE functional use, Postural dysfunction  Visit Diagnosis: Stiffness of left shoulder, not elsewhere classified  Muscle weakness (generalized)     Problem List There are no problems to display for this patient.   [name], PT, DPT 04/16/2020, 8:26 AM  [facility] [address] [place], [place], [address] Phone: [phone]   Fax:  [phone]  Name: Alvin Sanchez MRN: [id number] Date of Birth: 27-Nov-1956

## 2020-04-21 ENCOUNTER — Other Ambulatory Visit: Payer: Self-pay

## 2020-04-21 ENCOUNTER — Ambulatory Visit: Payer: Worker's Compensation | Attending: Orthopedic Surgery | Admitting: Physical Therapy

## 2020-04-21 ENCOUNTER — Encounter: Payer: Self-pay | Admitting: Physical Therapy

## 2020-04-21 DIAGNOSIS — M25612 Stiffness of left shoulder, not elsewhere classified: Secondary | ICD-10-CM

## 2020-04-21 DIAGNOSIS — M6281 Muscle weakness (generalized): Secondary | ICD-10-CM | POA: Diagnosis present

## 2020-04-21 NOTE — Therapy (Signed)
[facility] [address] [place], [place], [address] Phone: [phone]   Fax:  [phone]  Physical Therapy Treatment  Patient Details  Name: Alvin Sanchez MRN: [id number] Date of Birth: 06/06/1956 Referring Provider (PT): Jones Broom, MD   Encounter Date: 04/21/2020  PT End of Session - 04/21/20 0740    Visit Number  33    Number of Visits  42    Date for PT Re-Evaluation  05/22/20    Authorization Type  Worker's compensation 42 approved visits    Authorization - Visit Number  33    Authorization - Number of Visits  42    PT Start Time  0731    PT Stop Time  0820    PT Time Calculation (min)  49 min    Activity Tolerance  Patient tolerated treatment well    Behavior During Therapy  [facility] for tasks assessed/performed       Past Medical History:  Diagnosis Date  . Anxiety   . Kidney stones     Past Surgical History:  Procedure Laterality Date  . FRACTURE SURGERY     toe  . ROTATOR CUFF REPAIR Left 12/10/2019  . THYROIDECTOMY Right 02/04/2011   Dr. Sandi Carne, [facility]    There were no vitals filed for this visit.  Subjective Assessment - 04/21/20 0739    Subjective  COVID-19 screening performed upon arrival. Patient reports no new complaints.    Pertinent History  left RTC repair 12/10/2019    Limitations  Lifting;House hold activities    Patient Stated Goals  use arm normally again.    Currently in Pain?  No/denies         [facility] PT Assessment - 04/21/20 0001      Assessment   Medical Diagnosis  Traumatic rotator cuff tear left; shoulder impingement, left    Referring Provider (PT)  Jones Broom, MD    Onset Date/Surgical Date  12/10/19    Hand Dominance  Right    Next MD Visit  05/19/2020    Prior Therapy  no      Precautions   Precautions  Shoulder    Precaution Comments  Follow RTC protocol see media                    [facility] Adult PT Treatment/Exercise - 04/21/20 0001       Shoulder Exercises: Prone   Retraction  AROM;Left;20 reps;10 reps    Retraction Weight (lbs)  3    Horizontal ABduction 1  Strengthening;AROM;Left;20 reps;10 reps    Other Prone Exercises  scaption x30 no weight      Shoulder Exercises: Standing   Flexion  Strengthening;20 reps;Weights    Shoulder Flexion Weight (lbs)  3    Flexion Limitations  Both hand holding 3# weight, to middle shelf x30      Shoulder Exercises: Pulleys   Flexion  5 minutes      Shoulder Exercises: ROM/Strengthening   UBE (Upper Arm Bike)  60 RPM x8 mins    Ranger  standing ranger flexion, CW, CCW x3 minutes each      Modalities   Modalities  Electrical Stimulation;Vasopneumatic      Electrical Stimulation   Electrical Stimulation Location  L shoulder    Electrical Stimulation Action  pre-mod    Electrical Stimulation Parameters  80-150 hz x10 mins    Electrical Stimulation Goals  Pain      Vasopneumatic   Number Minutes Vasopneumatic  10 minutes    Vasopnuematic Location   Shoulder    Vasopneumatic Pressure  Low    Vasopneumatic Temperature   34                  PT Long Term Goals - 04/09/20 0801      PT LONG TERM GOAL #1   Title  Patient will be independent with HEP    Time  6    Period  Weeks    Status  Achieved      PT LONG TERM GOAL #2   Title  Patient will demonstrate 140+ degrees of left shoulder flexion AROM to improve ability to perform overhead tasks.    Time  6    Period  Weeks    Status  On-going      PT LONG TERM GOAL #3   Title  Patient will demonstrate 55+ degrees of left shoulder ER AROM to improve donning and doffing apparel.    Time  6    Period  Weeks    Status  Achieved      PT LONG TERM GOAL #4   Title  Patient will demonstrate 4/5 or greater left shoulder MMT in all planes to improve stability during functional tasks.    Time  6    Period  Weeks    Status  On-going      PT LONG TERM GOAL #5   Title  Patient will be independent with ADLs and with pain  less than or equal to 2/10 in left shoulder.    Time  6    Period  Weeks    Status  On-going            Plan - 04/21/20 0740    Clinical Impression Statement  Paient responded well to therapy session but with reports of fatigue throughout TEs. Patient demonstrated better form with TEs but still with slight trunk compensatory motions with standing ranger. No adverse affects upon removal of modalities.    Personal Factors and Comorbidities  Age;Comorbidity 1    Comorbidities  left RTC repair 12/10/2019 (15 weeks post op 03/24/2020)    Examination-Activity Limitations  Bathing;Reach Overhead;Carry;Dressing;Hygiene/Grooming;Lift    Stability/Clinical Decision Making  Stable/Uncomplicated    Clinical Decision Making  Low    Rehab Potential  Excellent    PT Frequency  3x / week    PT Duration  6 weeks    PT Treatment/Interventions  ADLs/Self Care Home Management;Cryotherapy;Electrical Stimulation;Moist Heat;Ultrasound;Neuromuscular re-education;Therapeutic activities;Therapeutic exercise;Functional mobility training;Patient/family education;Manual techniques;Vasopneumatic Device;Taping;Passive range of motion    PT Next Visit Plan  Continue AAROM and progress to AROM and scapular strengthening per protocol    PT Home Exercise Plan  see patient education section    Consulted and Agree with Plan of Care  Patient       Patient will benefit from skilled therapeutic intervention in order to improve the following deficits and impairments:  Decreased activity tolerance, Decreased strength, Decreased range of motion, Pain, Impaired UE functional use, Postural dysfunction  Visit Diagnosis: Stiffness of left shoulder, not elsewhere classified  Muscle weakness (generalized)     Problem List There are no problems to display for this patient.   [name], PT, DPT 04/21/2020, 8:27 AM  [facility] [address] [place], [place],  [address] Phone: [phone]   Fax:  [phone]  Name: Alvin Sanchez MRN: [id number] Date of Birth: Apr 20, 1956

## 2020-04-23 ENCOUNTER — Ambulatory Visit: Payer: Worker's Compensation | Admitting: Physical Therapy

## 2020-04-23 ENCOUNTER — Other Ambulatory Visit: Payer: Self-pay

## 2020-04-23 ENCOUNTER — Encounter: Payer: Self-pay | Admitting: Physical Therapy

## 2020-04-23 DIAGNOSIS — M25612 Stiffness of left shoulder, not elsewhere classified: Secondary | ICD-10-CM

## 2020-04-23 DIAGNOSIS — M6281 Muscle weakness (generalized): Secondary | ICD-10-CM

## 2020-04-23 NOTE — Therapy (Signed)
[facility] [address] [place], [place], [address] Phone: [phone]   Fax:  [phone]  Physical Therapy Treatment  Patient Details  Name: Alvin Sanchez MRN: [id number] Date of Birth: 08-30-1956 Referring Provider (PT): Jones Broom, MD   Encounter Date: 04/23/2020  PT End of Session - 04/23/20 0732    Visit Number  34    Number of Visits  42    Date for PT Re-Evaluation  05/22/20    Authorization Type  Worker's compensation 42 approved visits    Authorization - Visit Number  34    Authorization - Number of Visits  42    PT Start Time  0731    PT Stop Time  0818    PT Time Calculation (min)  47 min    Activity Tolerance  Patient tolerated treatment well    Behavior During Therapy  [facility] for tasks assessed/performed       Past Medical History:  Diagnosis Date  . Anxiety   . Kidney stones     Past Surgical History:  Procedure Laterality Date  . FRACTURE SURGERY     toe  . ROTATOR CUFF REPAIR Left 12/10/2019  . THYROIDECTOMY Right 02/04/2011   Dr. Sandi Carne, [facility]    There were no vitals filed for this visit.  Subjective Assessment - 04/23/20 0732    Subjective  COVID-19 screening performed upon arrival. Patient reports no new complaints.    Pertinent History  left RTC repair 12/10/2019    Limitations  Lifting;House hold activities    Patient Stated Goals  use arm normally again.    Currently in Pain?  No/denies         [facility] PT Assessment - 04/23/20 0001      Assessment   Medical Diagnosis  Traumatic rotator cuff tear left; shoulder impingement, left    Referring Provider (PT)  Jones Broom, MD    Onset Date/Surgical Date  12/10/19    Hand Dominance  Right    Next MD Visit  05/19/2020    Prior Therapy  no      Precautions   Precautions  Shoulder    Precaution Comments  Follow RTC protocol see media                    [facility] Adult PT Treatment/Exercise - 04/23/20 0001       Shoulder Exercises: Prone   Retraction  Strengthening;Left;20 reps;10 reps;Weights    Retraction Weight (lbs)  3    Horizontal ABduction 1  AROM;Left;20 reps;10 reps    Other Prone Exercises  scaption x30 no weight      Shoulder Exercises: Standing   Other Standing Exercises  2# ball to mid cabinet x20 reps    Other Standing Exercises  3# bicep curl with scap squeeze x30 reps      Shoulder Exercises: Pulleys   Flexion  5 minutes      Shoulder Exercises: ROM/Strengthening   UBE (Upper Arm Bike)  60 RPM x8 mins    Other ROM/Strengthening Exercises  Wall clock 12-9 x5 reps with 2# ball      Modalities   Modalities  Vasopneumatic      Vasopneumatic   Number Minutes Vasopneumatic   15 minutes    Vasopnuematic Location   Shoulder    Vasopneumatic Pressure  Low    Vasopneumatic Temperature   34  PT Long Term Goals - 04/09/20 0801      PT LONG TERM GOAL #1   Title  Patient will be independent with HEP    Time  6    Period  Weeks    Status  Achieved      PT LONG TERM GOAL #2   Title  Patient will demonstrate 140+ degrees of left shoulder flexion AROM to improve ability to perform overhead tasks.    Time  6    Period  Weeks    Status  On-going      PT LONG TERM GOAL #3   Title  Patient will demonstrate 55+ degrees of left shoulder ER AROM to improve donning and doffing apparel.    Time  6    Period  Weeks    Status  Achieved      PT LONG TERM GOAL #4   Title  Patient will demonstrate 4/5 or greater left shoulder MMT in all planes to improve stability during functional tasks.    Time  6    Period  Weeks    Status  On-going      PT LONG TERM GOAL #5   Title  Patient will be independent with ADLs and with pain less than or equal to 2/10 in left shoulder.    Time  6    Period  Weeks    Status  On-going            Plan - 04/23/20 0865    Clinical Impression Statement  Patient presented in clinic with lack of active L shoulder into  flexion. Patient guided through resisted functional exercises with intermittant L shoulder elevation noted. Muscle fatigue notable with therex strengthening and scapular retraction VC'd throughout treatment. Normal vasopnumatic response noted following removal of the modality.    Personal Factors and Comorbidities  Age;Comorbidity 1    Comorbidities  left RTC repair 12/10/2019 (15 weeks post op 03/24/2020)    Examination-Activity Limitations  Bathing;Reach Overhead;Carry;Dressing;Hygiene/Grooming;Lift    Stability/Clinical Decision Making  Stable/Uncomplicated    Rehab Potential  Excellent    PT Frequency  3x / week    PT Duration  6 weeks    PT Treatment/Interventions  ADLs/Self Care Home Management;Cryotherapy;Electrical Stimulation;Moist Heat;Ultrasound;Neuromuscular re-education;Therapeutic activities;Therapeutic exercise;Functional mobility training;Patient/family education;Manual techniques;Vasopneumatic Device;Taping;Passive range of motion    PT Next Visit Plan  Continue AAROM and progress to AROM and scapular strengthening per protocol    PT Home Exercise Plan  see patient education section    Consulted and Agree with Plan of Care  Patient       Patient will benefit from skilled therapeutic intervention in order to improve the following deficits and impairments:  Decreased activity tolerance, Decreased strength, Decreased range of motion, Pain, Impaired UE functional use, Postural dysfunction  Visit Diagnosis: Stiffness of left shoulder, not elsewhere classified  Muscle weakness (generalized)     Problem List There are no problems to display for this patient.   Standley Brooking, PTA 04/23/2020, 9:46 AM  [facility] [address] [place], [place], [address] Phone: [phone]   Fax:  [phone]  Name: Alvin Sanchez MRN: [id number] Date of Birth: 22-Nov-1956

## 2020-04-28 ENCOUNTER — Ambulatory Visit: Payer: Worker's Compensation | Admitting: Physical Therapy

## 2020-04-28 ENCOUNTER — Other Ambulatory Visit: Payer: Self-pay

## 2020-04-28 ENCOUNTER — Encounter: Payer: Self-pay | Admitting: Physical Therapy

## 2020-04-28 DIAGNOSIS — M25612 Stiffness of left shoulder, not elsewhere classified: Secondary | ICD-10-CM | POA: Diagnosis not present

## 2020-04-28 DIAGNOSIS — M6281 Muscle weakness (generalized): Secondary | ICD-10-CM

## 2020-04-28 NOTE — Therapy (Signed)
[facility] [place], [place], [address] Phone: [phone]   Fax:  [phone]  Physical Therapy Treatment  Patient Details  Name: Alvin Sanchez MRN: [id number] Date of Birth: 09/05/1956 Referring Provider (PT): Tania Ade, MD   Encounter Date: 04/28/2020  PT End of Session - 04/28/20 0758    Visit Number  35    Number of Visits  42    Date for PT Re-Evaluation  05/22/20    Authorization Type  Worker's compensation 33 approved visits    Authorization - Visit Number  32    Authorization - Number of Visits  42    PT Start Time  0730    PT Stop Time  0823    PT Time Calculation (min)  53 min    Activity Tolerance  Patient tolerated treatment well    Behavior During Therapy  [facility] for tasks assessed/performed       Past Medical History:  Diagnosis Date  . Anxiety   . Kidney stones     Past Surgical History:  Procedure Laterality Date  . FRACTURE SURGERY     toe  . ROTATOR CUFF REPAIR Left 12/10/2019  . THYROIDECTOMY Right 02/04/2011   Dr. Hardie Lora, [facility]    There were no vitals filed for this visit.  Subjective Assessment - 04/28/20 0733    Subjective  COVID-19 screening performed upon arrival. Patient reports doing well. Notices some improvements with reaching up.    Pertinent History  left RTC repair 12/10/2019    Limitations  Lifting;House hold activities    Patient Stated Goals  use arm normally again.    Currently in Pain?  No/denies         [facility] PT Assessment - 04/28/20 0001      Assessment   Medical Diagnosis  Traumatic rotator cuff tear left; shoulder impingement, left    Referring Provider (PT)  Tania Ade, MD    Onset Date/Surgical Date  12/10/19    Hand Dominance  Right    Next MD Visit  05/19/2020    Prior Therapy  no      Precautions   Precautions  Shoulder    Precaution Comments  Follow RTC protocol see media                    [facility] Adult PT  Treatment/Exercise - 04/28/20 0001      Shoulder Exercises: Prone   Retraction  Strengthening;Left;20 reps;10 reps;Weights    Retraction Weight (lbs)  3    Extension  AROM;Left;20 reps;10 reps    Extension Weight (lbs)  3    Other Prone Exercises  scaption x30 no weight      Shoulder Exercises: Standing   Protraction  Strengthening;Left;20 reps;Theraband    Theraband Level (Shoulder Protraction)  Level 1 (Yellow)    External Rotation  Strengthening;20 reps;10 reps;Theraband    Theraband Level (Shoulder External Rotation)  Level 1 (Yellow)    External Rotation Limitations  step outs    Row  Strengthening;Left;20 reps;Theraband;10 reps    Theraband Level (Shoulder Row)  Level 2 (Red)      Shoulder Exercises: Pulleys   Flexion  5 minutes      Shoulder Exercises: ROM/Strengthening   UBE (Upper Arm Bike)  60 RPM x8 mins    Ranger  standing ranger flexion, CW, CCW x3 minutes each      Modalities   Modalities  Vasopneumatic      Vasopneumatic  Number Minutes Vasopneumatic   10 minutes    Vasopnuematic Location   Shoulder    Vasopneumatic Pressure  Low    Vasopneumatic Temperature   34                  PT Long Term Goals - 04/09/20 0801      PT LONG TERM GOAL #1   Title  Patient will be independent with HEP    Time  6    Period  Weeks    Status  Achieved      PT LONG TERM GOAL #2   Title  Patient will demonstrate 140+ degrees of left shoulder flexion AROM to improve ability to perform overhead tasks.    Time  6    Period  Weeks    Status  On-going      PT LONG TERM GOAL #3   Title  Patient will demonstrate 55+ degrees of left shoulder ER AROM to improve donning and doffing apparel.    Time  6    Period  Weeks    Status  Achieved      PT LONG TERM GOAL #4   Title  Patient will demonstrate 4/5 or greater left shoulder MMT in all planes to improve stability during functional tasks.    Time  6    Period  Weeks    Status  On-going      PT LONG TERM GOAL #5    Title  Patient will be independent with ADLs and with pain less than or equal to 2/10 in left shoulder.    Time  6    Period  Weeks    Status  On-going            Plan - 04/28/20 0904    Clinical Impression Statement  Patient responded well to therapy session but reported fatiguing quickly as he began with UBE first. Patient demonstrates improve form with resisted RW exercises. Patient able to perform prone scaption but with ongoing fatigue. No adverse affects upon removal of modalities.    Personal Factors and Comorbidities  Age;Comorbidity 1    Comorbidities  left RTC repair 12/10/2019 (15 weeks post op 03/24/2020)    Examination-Activity Limitations  Bathing;Reach Overhead;Carry;Dressing;Hygiene/Grooming;Lift    Stability/Clinical Decision Making  Stable/Uncomplicated    Clinical Decision Making  Low    Rehab Potential  Excellent    PT Frequency  3x / week    PT Duration  6 weeks    PT Treatment/Interventions  ADLs/Self Care Home Management;Cryotherapy;Electrical Stimulation;Moist Heat;Ultrasound;Neuromuscular re-education;Therapeutic activities;Therapeutic exercise;Functional mobility training;Patient/family education;Manual techniques;Vasopneumatic Device;Taping;Passive range of motion    PT Next Visit Plan  Continue AAROM and progress to AROM and scapular strengthening per protocol    PT Home Exercise Plan  see patient education section    Consulted and Agree with Plan of Care  Patient       Patient will benefit from skilled therapeutic intervention in order to improve the following deficits and impairments:  Decreased activity tolerance, Decreased strength, Decreased range of motion, Pain, Impaired UE functional use, Postural dysfunction  Visit Diagnosis: Stiffness of left shoulder, not elsewhere classified  Muscle weakness (generalized)     Problem List There are no problems to display for this patient.   Guss Bunde, PT, DPT 04/28/2020, 9:08 AM  [facility] [address] [place], [place], [address] Phone: [phone]   Fax:  [phone]  Name: Alvin Sanchez MRN: [id number] Date of Birth: September 08, 1956

## 2020-04-30 ENCOUNTER — Encounter: Payer: Self-pay | Admitting: Physical Therapy

## 2020-04-30 ENCOUNTER — Ambulatory Visit: Payer: Worker's Compensation | Admitting: Physical Therapy

## 2020-04-30 ENCOUNTER — Other Ambulatory Visit: Payer: Self-pay

## 2020-04-30 DIAGNOSIS — M25612 Stiffness of left shoulder, not elsewhere classified: Secondary | ICD-10-CM

## 2020-04-30 DIAGNOSIS — M6281 Muscle weakness (generalized): Secondary | ICD-10-CM

## 2020-04-30 NOTE — Therapy (Signed)
[facility] [place], [place], [address] Phone: [phone]   Fax:  [phone]  Physical Therapy Treatment  Patient Details  Name: Alvin Sanchez MRN: [id number] Date of Birth: 09-28-1956 Referring Provider (PT): Tania Ade, MD   Encounter Date: 04/30/2020  PT End of Session - 04/30/20 0739    Visit Number  36    Number of Visits  42    Date for PT Re-Evaluation  05/22/20    Authorization Type  Worker's compensation 54 approved visits    Authorization - Visit Number  2    Authorization - Number of Visits  42    PT Start Time  0730    PT Stop Time  0815    PT Time Calculation (min)  45 min    Activity Tolerance  Patient tolerated treatment well    Behavior During Therapy  [facility] for tasks assessed/performed       Past Medical History:  Diagnosis Date  . Anxiety   . Kidney stones     Past Surgical History:  Procedure Laterality Date  . FRACTURE SURGERY     toe  . ROTATOR CUFF REPAIR Left 12/10/2019  . THYROIDECTOMY Right 02/04/2011   Dr. Hardie Lora, [facility]    There were no vitals filed for this visit.  Subjective Assessment - 04/30/20 0739    Subjective  COVID-19 screening performed upon arrival. Patient reports "coming along"    Pertinent History  left RTC repair 12/10/2019    Limitations  Lifting;House hold activities    Patient Stated Goals  use arm normally again.    Currently in Pain?  No/denies         [facility] PT Assessment - 04/30/20 0001      Assessment   Medical Diagnosis  Traumatic rotator cuff tear left; shoulder impingement, left    Referring Provider (PT)  Tania Ade, MD    Onset Date/Surgical Date  12/10/19    Hand Dominance  Right    Next MD Visit  05/19/2020    Prior Therapy  no      Precautions   Precautions  Shoulder    Precaution Comments  Follow RTC protocol see media                    [facility] Adult PT Treatment/Exercise - 04/30/20 0001      Shoulder Exercises: Standing   Extension  Both;20 reps;10 reps    Extension Limitations  green XTS    Row  Strengthening;20 reps;10 reps    Row Limitations  green XTS    Diagonals  Strengthening;20 reps;10 reps    Diagonals Limitations  chop/lift x30 each Green XTS    Other Standing Exercises  cone to middle shelf x30 left UE only      Shoulder Exercises: Pulleys   Flexion  5 minutes      Shoulder Exercises: ROM/Strengthening   UBE (Upper Arm Bike)  60 RPM x8 mins    Wall Pushups  10 reps    Other ROM/Strengthening Exercises  Wall clock 12-9 x10 reps no weight    Other ROM/Strengthening Exercises  wall angels x5      Modalities   Modalities  Vasopneumatic      Vasopneumatic   Number Minutes Vasopneumatic   10 minutes    Vasopnuematic Location   Shoulder    Vasopneumatic Pressure  Low    Vasopneumatic Temperature   34  PT Long Term Goals - 04/09/20 0801      PT LONG TERM GOAL #1   Title  Patient will be independent with HEP    Time  6    Period  Weeks    Status  Achieved      PT LONG TERM GOAL #2   Title  Patient will demonstrate 140+ degrees of left shoulder flexion AROM to improve ability to perform overhead tasks.    Time  6    Period  Weeks    Status  On-going      PT LONG TERM GOAL #3   Title  Patient will demonstrate 55+ degrees of left shoulder ER AROM to improve donning and doffing apparel.    Time  6    Period  Weeks    Status  Achieved      PT LONG TERM GOAL #4   Title  Patient will demonstrate 4/5 or greater left shoulder MMT in all planes to improve stability during functional tasks.    Time  6    Period  Weeks    Status  On-going      PT LONG TERM GOAL #5   Title  Patient will be independent with ADLs and with pain less than or equal to 2/10 in left shoulder.    Time  6    Period  Weeks    Status  On-going            Plan - 04/30/20 9983    Clinical Impression Statement  Patient responded well to the addition  of XTS exercises. Patient still fatigues rather quickly which causes compensatory motions in the UT but patient recongizes when to rest to maintain proper form and technique.    Personal Factors and Comorbidities  Age;Comorbidity 1    Comorbidities  left RTC repair 12/10/2019 (15 weeks post op 03/24/2020)    Examination-Activity Limitations  Bathing;Reach Overhead;Carry;Dressing;Hygiene/Grooming;Lift    Stability/Clinical Decision Making  Stable/Uncomplicated    Clinical Decision Making  Low    Rehab Potential  Excellent    PT Frequency  3x / week    PT Duration  6 weeks    PT Treatment/Interventions  ADLs/Self Care Home Management;Cryotherapy;Electrical Stimulation;Moist Heat;Ultrasound;Neuromuscular re-education;Therapeutic activities;Therapeutic exercise;Functional mobility training;Patient/family education;Manual techniques;Vasopneumatic Device;Taping;Passive range of motion    PT Next Visit Plan  Continue AAROM and progress to AROM and scapular strengthening per protocol    PT Home Exercise Plan  see patient education section    Consulted and Agree with Plan of Care  Patient       Patient will benefit from skilled therapeutic intervention in order to improve the following deficits and impairments:  Decreased activity tolerance, Decreased strength, Decreased range of motion, Pain, Impaired UE functional use, Postural dysfunction  Visit Diagnosis: Stiffness of left shoulder, not elsewhere classified  Muscle weakness (generalized)     Problem List There are no problems to display for this patient.   Guss Bunde, PT, DPT 04/30/2020, 8:25 AM  [facility] [address] [place], [place], [address] Phone: [phone]   Fax:  [phone]  Name: Stephaun Million MRN: [id number] Date of Birth: 1956/07/11

## 2020-05-05 ENCOUNTER — Ambulatory Visit: Payer: Worker's Compensation | Attending: Orthopedic Surgery | Admitting: Physical Therapy

## 2020-05-05 ENCOUNTER — Encounter: Payer: Self-pay | Admitting: Physical Therapy

## 2020-05-05 ENCOUNTER — Other Ambulatory Visit: Payer: Self-pay

## 2020-05-05 DIAGNOSIS — M6281 Muscle weakness (generalized): Secondary | ICD-10-CM | POA: Diagnosis present

## 2020-05-05 DIAGNOSIS — M25612 Stiffness of left shoulder, not elsewhere classified: Secondary | ICD-10-CM

## 2020-05-05 NOTE — Therapy (Signed)
[facility] [place], [place], [address] Phone: [phone]   Fax:  [phone]  Physical Therapy Treatment  Patient Details  Name: Alvin Sanchez MRN: [id number] Date of Birth: [birth year] Referring Provider (PT): Tania Ade, MD   Encounter Date: 05/05/2020  PT End of Session - 05/05/20 0740    Visit Number  37    Number of Visits  42    Date for PT Re-Evaluation  05/22/20    Authorization Type  Worker's compensation 42 approved visits    Authorization - Visit Number  72    Authorization - Number of Visits  42    PT Start Time  0731    PT Stop Time  0815    PT Time Calculation (min)  44 min    Activity Tolerance  Patient tolerated treatment well    Behavior During Therapy  [facility] for tasks assessed/performed       Past Medical History:  Diagnosis Date  . Anxiety   . Kidney stones     Past Surgical History:  Procedure Laterality Date  . FRACTURE SURGERY     toe  . ROTATOR CUFF REPAIR Left 12/10/2019  . THYROIDECTOMY Right 02/04/2011   Dr. Hardie Lora, [facility]    There were no vitals filed for this visit.  Subjective Assessment - 05/05/20 0736    Subjective  COVID-19 screening performed upon arrival. No new complaints upon arrival.    Pertinent History  left RTC repair 12/10/2019    Limitations  Lifting;House hold activities    Patient Stated Goals  use arm normally again.    Currently in Pain?  No/denies         [facility] PT Assessment - 05/05/20 0001      Assessment   Medical Diagnosis  Traumatic rotator cuff tear left; shoulder impingement, left    Referring Provider (PT)  Tania Ade, MD    Onset Date/Surgical Date  12/10/19    Hand Dominance  Right    Next MD Visit  05/19/2020    Prior Therapy  no      Precautions   Precautions  Shoulder    Precaution Comments  Follow RTC protocol see media                    [facility] Adult PT Treatment/Exercise - 05/05/20 0001      Shoulder Exercises: Prone   Flexion  AROM;Left;15 reps    Horizontal ABduction 1  AROM;Left;20 reps    Horizontal ABduction 1 Limitations  thumb down    Other Prone Exercises  LUE scaption x20 reps       Shoulder Exercises: Standing   Extension  Both;20 reps;10 reps    Extension Limitations  green XTS    Row  Strengthening;20 reps;10 reps    Row Limitations  green XTS    Diagonals  Strengthening;Right;20 reps;Limitations    Diagonals Limitations  green XTS    Other Standing Exercises  cone to middle shelf x10 left UE only 2-3#   more compensation with 3# and with fatigue     Shoulder Exercises: Pulleys   Flexion  5 minutes      Shoulder Exercises: ROM/Strengthening   UBE (Upper Arm Bike)  60 RPM x8 mins    Wall Pushups  --    Other ROM/Strengthening Exercises  LUE wall clock 12-8 x5 reps      Modalities   Modalities  Cryotherapy;Electrical Stimulation      Cryotherapy  Number Minutes Cryotherapy  10 Minutes    Cryotherapy Location  Shoulder    Type of Cryotherapy  Ice pack      Electrical Stimulation   Electrical Stimulation Location  L shoulder    Electrical Stimulation Action  Pre-Mod    Electrical Stimulation Parameters  80-150 hz x10 min    Electrical Stimulation Goals  Other (comment)   fatigue                 PT Long Term Goals - 05/05/20 8338      PT LONG TERM GOAL #1   Title  Patient will be independent with HEP    Time  6    Period  Weeks    Status  Achieved      PT LONG TERM GOAL #2   Title  Patient will demonstrate 140+ degrees of left shoulder flexion AROM to improve ability to perform overhead tasks.    Time  6    Period  Weeks    Status  On-going      PT LONG TERM GOAL #3   Title  Patient will demonstrate 55+ degrees of left shoulder ER AROM to improve donning and doffing apparel.    Time  6    Period  Weeks    Status  Achieved      PT LONG TERM GOAL #4   Title  Patient will demonstrate 4/5 or greater left shoulder MMT in all  planes to improve stability during functional tasks.    Time  6    Period  Weeks    Status  On-going      PT LONG TERM GOAL #5   Title  Patient will be independent with ADLs and with pain less than or equal to 2/10 in left shoulder.    Time  6    Period  Weeks    Status  Achieved            Plan - 05/05/20 0809    Clinical Impression Statement  Patient presented in clinic with continued functional flexion of LUE. With any increased weight or fatigue L shoulder elevation takes place. More periscapular strengthening without resistance completed. Patient is R handed and does more work with RUE at work. Patient limited with LUE flexion and intermittantly with LUE abduction. Proper technique without shoulder elevation was primary focus of prone/bent periscapular exercises which patient was able to complete well. Fatigue is main complaint from patient during treatment. Normal modalities response noted following removal of the modalities.    Personal Factors and Comorbidities  Age;Comorbidity 1    Comorbidities  left RTC repair 12/10/2019 (15 weeks post op 03/24/2020)    Examination-Activity Limitations  Bathing;Reach Overhead;Carry;Dressing;Hygiene/Grooming;Lift    Stability/Clinical Decision Making  Stable/Uncomplicated    Rehab Potential  Excellent    PT Frequency  3x / week    PT Duration  6 weeks    PT Treatment/Interventions  ADLs/Self Care Home Management;Cryotherapy;Electrical Stimulation;Moist Heat;Ultrasound;Neuromuscular re-education;Therapeutic activities;Therapeutic exercise;Functional mobility training;Patient/family education;Manual techniques;Vasopneumatic Device;Taping;Passive range of motion    PT Next Visit Plan  Continue stabilization and periscapular exercises for strength and function.    PT Home Exercise Plan  see patient education section    Consulted and Agree with Plan of Care  Patient       Patient will benefit from skilled therapeutic intervention in order to  improve the following deficits and impairments:  Decreased activity tolerance, Decreased strength, Decreased range of motion, Pain, Impaired UE functional use,  Postural dysfunction  Visit Diagnosis: Stiffness of left shoulder, not elsewhere classified  Muscle weakness (generalized)     Problem List There are no problems to display for this patient.   Marvell Fuller, PTA 05/05/2020, 8:21 AM  [facility] [address] [place], [place], [address] Phone: [phone]   Fax:  [phone]  Name: Ayomikun Starling MRN: [id number] Date of Birth: 04-27-1956

## 2020-05-07 ENCOUNTER — Ambulatory Visit: Payer: Worker's Compensation | Attending: Orthopedic Surgery | Admitting: Physical Therapy

## 2020-05-07 ENCOUNTER — Other Ambulatory Visit: Payer: Self-pay

## 2020-05-07 ENCOUNTER — Encounter: Payer: Self-pay | Admitting: Physical Therapy

## 2020-05-07 DIAGNOSIS — M25612 Stiffness of left shoulder, not elsewhere classified: Secondary | ICD-10-CM

## 2020-05-07 DIAGNOSIS — M6281 Muscle weakness (generalized): Secondary | ICD-10-CM | POA: Diagnosis not present

## 2020-05-07 NOTE — Therapy (Signed)
[facility] [address] [place], [place], [address] Phone: [phone]   Fax:  [phone]  Physical Therapy Treatment  Patient Details  Name: Alvin Sanchez MRN: [id number] Date of Birth: 05-17-1956 Referring Provider (PT): Jones Broom, MD   Encounter Date: 05/07/2020  PT End of Session - 05/07/20 0744    Visit Number  38    Number of Visits  42    Date for PT Re-Evaluation  05/22/20    Authorization Type  Worker's compensation 42 approved visits    Authorization - Visit Number  38    Authorization - Number of Visits  42    PT Start Time  0730    PT Stop Time  0820    PT Time Calculation (min)  50 min    Activity Tolerance  Patient tolerated treatment well    Behavior During Therapy  [facility] for tasks assessed/performed       Past Medical History:  Diagnosis Date  . Anxiety   . Kidney stones     Past Surgical History:  Procedure Laterality Date  . FRACTURE SURGERY     toe  . ROTATOR CUFF REPAIR Left 12/10/2019  . THYROIDECTOMY Right 02/04/2011   Dr. Sandi Carne, [facility]    There were no vitals filed for this visit.  Subjective Assessment - 05/07/20 0737    Subjective  COVID-19 screening performed upon arrival. Patient reports doing fine but still with ROM deficits during functional activities. States he can do things some days but other days he can't.    Pertinent History  left RTC repair 12/10/2019    Limitations  Lifting;House hold activities    Patient Stated Goals  use arm normally again.    Currently in Pain?  No/denies    Pain Onset  --         [facility] PT Assessment - 05/07/20 0001      Assessment   Medical Diagnosis  Traumatic rotator cuff tear left; shoulder impingement, left    Referring Provider (PT)  Jones Broom, MD    Onset Date/Surgical Date  12/10/19    Hand Dominance  Right    Next MD Visit  05/19/2020    Prior Therapy  no      Precautions   Precautions  Shoulder    Precaution  Comments  Follow RTC protocol see media                    [facility] Adult PT Treatment/Exercise - 05/07/20 0001      Exercises   Exercises  Shoulder      Shoulder Exercises: Standing   Extension  Both;20 reps;10 reps    Extension Limitations  green XTS    Row  Strengthening;20 reps;10 reps    Row Limitations  green XTS    Diagonals  Strengthening;Right;20 reps;Limitations    Diagonals Limitations  green XTS    Other Standing Exercises  3# bicep curl with scap squeeze x30 reps      Shoulder Exercises: Pulleys   Flexion  5 minutes      Shoulder Exercises: ROM/Strengthening   UBE (Upper Arm Bike)  60 RPM x8 mins    Ranger  standing ranger flexion, CW, CCW x3 minutes each      Modalities   Modalities  Vasopneumatic      Vasopneumatic   Number Minutes Vasopneumatic   10 minutes    Vasopnuematic Location   Shoulder    Vasopneumatic Pressure  Low  Vasopneumatic Temperature   34                  PT Long Term Goals - 05/05/20 4034      PT LONG TERM GOAL #1   Title  Patient will be independent with HEP    Time  6    Period  Weeks    Status  Achieved      PT LONG TERM GOAL #2   Title  Patient will demonstrate 140+ degrees of left shoulder flexion AROM to improve ability to perform overhead tasks.    Time  6    Period  Weeks    Status  On-going      PT LONG TERM GOAL #3   Title  Patient will demonstrate 55+ degrees of left shoulder ER AROM to improve donning and doffing apparel.    Time  6    Period  Weeks    Status  Achieved      PT LONG TERM GOAL #4   Title  Patient will demonstrate 4/5 or greater left shoulder MMT in all planes to improve stability during functional tasks.    Time  6    Period  Weeks    Status  On-going      PT LONG TERM GOAL #5   Title  Patient will be independent with ADLs and with pain less than or equal to 2/10 in left shoulder.    Time  6    Period  Weeks    Status  Achieved            Plan - 05/07/20 0754     Clinical Impression Statement  Patient responded well to therapy session. Patient noted with improved scapulohumeral rhythm but quick to fatigue. Patient feels 80% better but still with limited function particularly with reaching up and forward and driving. Normal response to modalities upon removal.    Personal Factors and Comorbidities  Age;Comorbidity 1    Comorbidities  left RTC repair 12/10/2019 (15 weeks post op 03/24/2020)    Examination-Activity Limitations  Bathing;Reach Overhead;Carry;Dressing;Hygiene/Grooming;Lift    Stability/Clinical Decision Making  Stable/Uncomplicated    Clinical Decision Making  Low    Rehab Potential  Excellent    PT Frequency  3x / week    PT Duration  6 weeks    PT Treatment/Interventions  ADLs/Self Care Home Management;Cryotherapy;Electrical Stimulation;Moist Heat;Ultrasound;Neuromuscular re-education;Therapeutic activities;Therapeutic exercise;Functional mobility training;Patient/family education;Manual techniques;Vasopneumatic Device;Taping;Passive range of motion    PT Next Visit Plan  Continue stabilization and periscapular exercises for strength and function.    PT Home Exercise Plan  see patient education section    Consulted and Agree with Plan of Care  Patient       Patient will benefit from skilled therapeutic intervention in order to improve the following deficits and impairments:  Decreased activity tolerance, Decreased strength, Decreased range of motion, Pain, Impaired UE functional use, Postural dysfunction  Visit Diagnosis: Muscle weakness (generalized)  Stiffness of left shoulder, not elsewhere classified     Problem List There are no problems to display for this patient.   [name], PT, DPT 05/07/2020, 8:24 AM  [facility] [address] [place], [place], [address] Phone: [phone]   Fax:  [phone]  Name: Alvin Sanchez MRN: [id number] Date of Birth: [birth year]

## 2020-05-12 ENCOUNTER — Ambulatory Visit: Payer: Worker's Compensation | Admitting: Physical Therapy

## 2020-05-12 ENCOUNTER — Other Ambulatory Visit: Payer: Self-pay

## 2020-05-12 ENCOUNTER — Encounter: Payer: Self-pay | Admitting: Physical Therapy

## 2020-05-12 DIAGNOSIS — M25612 Stiffness of left shoulder, not elsewhere classified: Secondary | ICD-10-CM

## 2020-05-12 DIAGNOSIS — M6281 Muscle weakness (generalized): Secondary | ICD-10-CM

## 2020-05-12 NOTE — Therapy (Signed)
[facility] [address] [place], [place], [address] Phone: [phone]   Fax:  [phone]  Physical Therapy Treatment  Patient Details  Name: Alvin Sanchez MRN: [id number] Date of Birth: [birth year] Referring Provider (PT): Jones Broom, MD   Encounter Date: 05/12/2020  PT End of Session - 05/12/20 0736    Visit Number  39    Number of Visits  42    Date for PT Re-Evaluation  05/22/20    Authorization Type  Worker's compensation 42 approved visits    Authorization - Visit Number  39    Authorization - Number of Visits  42    PT Start Time  0731    PT Stop Time  0819    PT Time Calculation (min)  48 min    Activity Tolerance  Patient tolerated treatment well    Behavior During Therapy  [facility] for tasks assessed/performed       Past Medical History:  Diagnosis Date  . Anxiety   . Kidney stones     Past Surgical History:  Procedure Laterality Date  . FRACTURE SURGERY     toe  . ROTATOR CUFF REPAIR Left 12/10/2019  . THYROIDECTOMY Right 02/04/2011   Dr. Sandi Carne, [facility]    There were no vitals filed for this visit.  Subjective Assessment - 05/12/20 0735    Subjective  COVID-19 screening performed upon arrival. Patient reports doing fair    Pertinent History  left RTC repair 12/10/2019    Limitations  Lifting;House hold activities    Patient Stated Goals  use arm normally again.    Currently in Pain?  No/denies         [facility] PT Assessment - 05/12/20 0001      Assessment   Medical Diagnosis  Traumatic rotator cuff tear left; shoulder impingement, left    Referring Provider (PT)  Jones Broom, MD    Onset Date/Surgical Date  12/10/19    Hand Dominance  Right    Next MD Visit  05/19/2020    Prior Therapy  no      Precautions   Precautions  Shoulder    Precaution Comments  Follow RTC protocol see media                    [facility] Adult PT Treatment/Exercise - 05/12/20 0001      Exercises    Exercises  Shoulder      Shoulder Exercises: Standing   External Rotation  Strengthening;10 reps;AAROM   with PT assist   Theraband Level (Shoulder External Rotation)  Level 1 (Yellow)    Flexion  AAROM;Both;20 reps   with prayer hands   Extension  Strengthening;Both;10 reps;20 reps    Extension Limitations  Blue XTS    Row  Strengthening;20 reps;10 reps    Row Limitations  Blue XTS    Diagonals  Strengthening;Right;20 reps;10 reps;Limitations    Diagonals Limitations  Blue XTS    Other Standing Exercises  L Jar to bottom and middle shelf x20; jar to top shelf B UEs x20      Shoulder Exercises: Pulleys   Flexion  5 minutes      Shoulder Exercises: ROM/Strengthening   UBE (Upper Arm Bike)  60 RPM x8 mins    "W" Arms  x5 against wall    Other ROM/Strengthening Exercises  LUE wall clock 12-8 x5 reps      Modalities   Modalities  Vasopneumatic      Vasopneumatic  Number Minutes Vasopneumatic   10 minutes    Vasopnuematic Location   Shoulder    Vasopneumatic Pressure  Low    Vasopneumatic Temperature   34                  PT Long Term Goals - 05/05/20 8916      PT LONG TERM GOAL #1   Title  Patient will be independent with HEP    Time  6    Period  Weeks    Status  Achieved      PT LONG TERM GOAL #2   Title  Patient will demonstrate 140+ degrees of left shoulder flexion AROM to improve ability to perform overhead tasks.    Time  6    Period  Weeks    Status  On-going      PT LONG TERM GOAL #3   Title  Patient will demonstrate 55+ degrees of left shoulder ER AROM to improve donning and doffing apparel.    Time  6    Period  Weeks    Status  Achieved      PT LONG TERM GOAL #4   Title  Patient will demonstrate 4/5 or greater left shoulder MMT in all planes to improve stability during functional tasks.    Time  6    Period  Weeks    Status  On-going      PT LONG TERM GOAL #5   Title  Patient will be independent with ADLs and with pain less than or  equal to 2/10 in left shoulder.    Time  6    Period  Weeks    Status  Achieved            Plan - 05/12/20 0857    Clinical Impression Statement  Patient responded well to therpay session but with ongoing fatigue and pain. Patient able to perform new TEs but with cuing for technique and to prevent UT compensation. Ball on wall TE attempted but unable due to pain; exercise terminated. No adverse affects upon removal of modalities.    Personal Factors and Comorbidities  Age;Comorbidity 1    Comorbidities  left RTC repair 12/10/2019 (15 weeks post op 03/24/2020)    Examination-Activity Limitations  Bathing;Reach Overhead;Carry;Dressing;Hygiene/Grooming;Lift    Stability/Clinical Decision Making  Stable/Uncomplicated    Clinical Decision Making  Low    Rehab Potential  Excellent    PT Frequency  3x / week    PT Duration  6 weeks    PT Treatment/Interventions  ADLs/Self Care Home Management;Cryotherapy;Electrical Stimulation;Moist Heat;Ultrasound;Neuromuscular re-education;Therapeutic activities;Therapeutic exercise;Functional mobility training;Patient/family education;Manual techniques;Vasopneumatic Device;Taping;Passive range of motion    PT Next Visit Plan  Continue stabilization and periscapular exercises for strength and function.    PT Home Exercise Plan  see patient education section    Consulted and Agree with Plan of Care  Patient       Patient will benefit from skilled therapeutic intervention in order to improve the following deficits and impairments:  Decreased activity tolerance, Decreased strength, Decreased range of motion, Pain, Impaired UE functional use, Postural dysfunction  Visit Diagnosis: Muscle weakness (generalized)  Stiffness of left shoulder, not elsewhere classified     Problem List There are no problems to display for this patient.   Guss Bunde, PT, DPT 05/12/2020, 9:22 AM  [facility] [address] [place], [place], [address] Phone: [phone]   Fax:  [phone]  Name: Alvin Sanchez MRN: [id number]  Date of Birth: 12-15-1955

## 2020-05-14 ENCOUNTER — Other Ambulatory Visit: Payer: Self-pay

## 2020-05-14 ENCOUNTER — Encounter: Payer: Self-pay | Admitting: Physical Therapy

## 2020-05-14 ENCOUNTER — Ambulatory Visit: Payer: Worker's Compensation | Admitting: Physical Therapy

## 2020-05-14 DIAGNOSIS — M6281 Muscle weakness (generalized): Secondary | ICD-10-CM | POA: Diagnosis not present

## 2020-05-14 DIAGNOSIS — M25612 Stiffness of left shoulder, not elsewhere classified: Secondary | ICD-10-CM

## 2020-05-14 NOTE — Therapy (Signed)
[facility] [address] [place], [place], [address] Phone: [phone]   Fax:  [phone]  Physical Therapy Treatment  Progress Note Reporting Period 04/14/2020 to 05/14/2020  See note below for Objective Data and Assessment of Progress/Goals. Patient making slow progressions towards goals but reported improvements with functional activities at home.    Patient Details  Name: Alvin Sanchez MRN: [id number] Date of Birth: Oct 16, 1956 Referring Provider (PT): Jones Broom, MD   Encounter Date: 05/14/2020   PT End of Session - 05/14/20 0850    Visit Number 40    Number of Visits 42    Date for PT Re-Evaluation 05/22/20    Authorization Type Worker's compensation 42 approved visits    Authorization - Visit Number 40    Authorization - Number of Visits 42    PT Start Time 0731    PT Stop Time 0819    PT Time Calculation (min) 48 min    Activity Tolerance Patient tolerated treatment well    Behavior During Therapy [facility] for tasks assessed/performed           Past Medical History:  Diagnosis Date  . Anxiety   . Kidney stones     Past Surgical History:  Procedure Laterality Date  . FRACTURE SURGERY     toe  . ROTATOR CUFF REPAIR Left 12/10/2019  . THYROIDECTOMY Right 02/04/2011   Dr. Sandi Carne, [facility]    There were no vitals filed for this visit.   Subjective Assessment - 05/14/20 0742    Subjective COVID-19 screening performed upon arrival. Patient arrives with no new complaints. Reports being consistent with new HEP.    Pertinent History left RTC repair 12/10/2019    Limitations Lifting;House hold activities    Patient Stated Goals use arm normally again.    Currently in Pain? No/denies              [facility] PT Assessment - 05/14/20 0001      Assessment   Medical Diagnosis Traumatic rotator cuff tear left; shoulder impingement, left    Referring Provider (PT) Jones Broom, MD    Onset Date/Surgical  Date 12/10/19    Hand Dominance Right    Next MD Visit 05/19/2020    Prior Therapy no      Precautions   Precautions Shoulder    Precaution Comments Follow RTC protocol see media                         [facility] Adult PT Treatment/Exercise - 05/14/20 0001      Exercises   Exercises Shoulder      Shoulder Exercises: Standing   Protraction Strengthening;Left;Theraband;10 reps    Theraband Level (Shoulder Protraction) Level 1 (Yellow)    External Rotation Strengthening;10 reps;AAROM    Theraband Level (Shoulder External Rotation) Level 1 (Yellow)    Internal Rotation Strengthening;Left;Theraband;20 reps;10 reps    Theraband Level (Shoulder Internal Rotation) Level 2 (Red)    Extension Strengthening;Both;10 reps;20 reps    Extension Limitations Blue XTS    Row Strengthening;20 reps;10 reps    Row Limitations Blue XTS      Shoulder Exercises: Pulleys   Flexion 5 minutes      Shoulder Exercises: ROM/Strengthening   UBE (Upper Arm Bike) 60 RPM x8 mins    Ranger standing ranger flexion, CW, CCW x3 minutes each      Modalities   Modalities Vasopneumatic      Vasopneumatic   Number  Minutes Vasopneumatic  10 minutes    Vasopnuematic Location  Shoulder    Vasopneumatic Pressure Low    Vasopneumatic Temperature  34                       PT Long Term Goals - 05/05/20 1660      PT LONG TERM GOAL #1   Title Patient will be independent with HEP    Time 6    Period Weeks    Status Achieved      PT LONG TERM GOAL #2   Title Patient will demonstrate 140+ degrees of left shoulder flexion AROM to improve ability to perform overhead tasks.    Time 6    Period Weeks    Status On-going      PT LONG TERM GOAL #3   Title Patient will demonstrate 55+ degrees of left shoulder ER AROM to improve donning and doffing apparel.    Time 6    Period Weeks    Status Achieved      PT LONG TERM GOAL #4   Title Patient will demonstrate 4/5 or greater left shoulder  MMT in all planes to improve stability during functional tasks.    Time 6    Period Weeks    Status On-going      PT LONG TERM GOAL #5   Title Patient will be independent with ADLs and with pain less than or equal to 2/10 in left shoulder.    Time 6    Period Weeks    Status Achieved                  Plan - 05/14/20 0803    Clinical Impression Statement Patient arrives with ongoing discomfort and fatigue with TEs but able to complete with rest breaks. Patient still with ongoing deficits with left shoulder AROM but reports noticing an improvement with functional activities at home. No adverse affects upon removal of modalities.    Personal Factors and Comorbidities Age;Comorbidity 1    Comorbidities left RTC repair 12/10/2019 (23 weeks post op 05/19/2020)    Examination-Activity Limitations Bathing;Reach Overhead;Carry;Dressing;Hygiene/Grooming;Lift    Stability/Clinical Decision Making Stable/Uncomplicated    Clinical Decision Making Low    Rehab Potential Excellent    PT Frequency 3x / week    PT Duration 6 weeks    PT Treatment/Interventions ADLs/Self Care Home Management;Cryotherapy;Electrical Stimulation;Moist Heat;Ultrasound;Neuromuscular re-education;Therapeutic activities;Therapeutic exercise;Functional mobility training;Patient/family education;Manual techniques;Vasopneumatic Device;Taping;Passive range of motion    PT Next Visit Plan MD note; Continue stabilization and periscapular exercises for strength and function.    PT Home Exercise Plan see patient education section    Consulted and Agree with Plan of Care Patient           Patient will benefit from skilled therapeutic intervention in order to improve the following deficits and impairments:  Decreased activity tolerance, Decreased strength, Decreased range of motion, Pain, Impaired UE functional use, Postural dysfunction  Visit Diagnosis: Muscle weakness (generalized)  Stiffness of left shoulder, not elsewhere  classified     Problem List There are no problems to display for this patient.   [name], PT, DPT 05/14/2020, 8:57 AM  [facility] [address] [place], [place], [address] Phone: [phone]   Fax:  [phone]  Name: Cane Dubray MRN: [id number] Date of Birth: Oct 14, 1956

## 2020-05-19 ENCOUNTER — Other Ambulatory Visit: Payer: Self-pay

## 2020-05-19 ENCOUNTER — Ambulatory Visit: Payer: Worker's Compensation | Admitting: Physical Therapy

## 2020-05-19 ENCOUNTER — Encounter: Payer: Self-pay | Admitting: Physical Therapy

## 2020-05-19 DIAGNOSIS — M6281 Muscle weakness (generalized): Secondary | ICD-10-CM

## 2020-05-19 DIAGNOSIS — M25612 Stiffness of left shoulder, not elsewhere classified: Secondary | ICD-10-CM

## 2020-05-19 NOTE — Therapy (Signed)
[facility] [address] [place], [place], [address] Phone: [phone]   Fax:  [phone]  Physical Therapy Treatment  Patient Details  Name: Alvin Sanchez MRN: [id number] Date of Birth: 1956/02/17 Referring Provider (PT): Jones Broom, MD   Encounter Date: 05/19/2020   PT End of Session - 05/19/20 0814    Visit Number 41    Number of Visits 42    Date for PT Re-Evaluation 05/22/20    Authorization Type Worker's compensation 42 approved visits    Authorization - Visit Number 41    Authorization - Number of Visits 42    PT Start Time 0730    PT Stop Time 0820    PT Time Calculation (min) 50 min    Activity Tolerance Patient tolerated treatment well    Behavior During Therapy [facility] for tasks assessed/performed           Past Medical History:  Diagnosis Date  . Anxiety   . Kidney stones     Past Surgical History:  Procedure Laterality Date  . FRACTURE SURGERY     toe  . ROTATOR CUFF REPAIR Left 12/10/2019  . THYROIDECTOMY Right 02/04/2011   Dr. Sandi Carne, [facility]    There were no vitals filed for this visit.   Subjective Assessment - 05/19/20 0759    Subjective COVID-19 screening performed upon arrival. Patient returns to the surgeon for a follow up tomorrow, 05/20/2020. Reports shoulder is doing alright. No pain at rest but with ongoing average 3-4/10 pain with certain movements.    Pertinent History left RTC repair 12/10/2019    Limitations Lifting;House hold activities    Patient Stated Goals use arm normally again.    Currently in Pain? No/denies              [facility] PT Assessment - 05/19/20 0001      Assessment   Medical Diagnosis Traumatic rotator cuff tear left; shoulder impingement, left    Referring Provider (PT) Jones Broom, MD    Onset Date/Surgical Date 12/10/19    Hand Dominance Right    Next MD Visit 05/20/2020    Prior Therapy no      Precautions   Precautions Shoulder     Precaution Comments Follow RTC protocol see media      AROM   Left Shoulder Flexion 156 Degrees    Left Shoulder ABduction 70 Degrees    Left Shoulder Internal Rotation --   T11-12   Left Shoulder External Rotation 63 Degrees   T2     PROM   Left Shoulder Flexion 162 Degrees    Left Shoulder ABduction 140 Degrees    Left Shoulder External Rotation 70 Degrees                         [facility] Adult PT Treatment/Exercise - 05/19/20 0001      Exercises   Exercises Shoulder      Shoulder Exercises: Prone   Retraction Strengthening;Left;20 reps;10 reps;Weights    Retraction Weight (lbs) 4    Extension AROM;Left;20 reps;10 reps    Extension Weight (lbs) 4      Shoulder Exercises: Standing   Protraction Strengthening;Left;Theraband;10 reps    Theraband Level (Shoulder Protraction) Level 1 (Yellow)    External Rotation Strengthening;10 reps;AAROM    Theraband Level (Shoulder External Rotation) Level 1 (Yellow)    External Rotation Limitations step outs    Internal Rotation Strengthening;Left;Theraband;20 reps;10 reps    Theraband  Level (Shoulder Internal Rotation) Level 2 (Red)    Row Strengthening;20 reps;10 reps    Theraband Level (Shoulder Row) Level 2 (Red)    Diagonals Strengthening;Right;20 reps;10 reps;Limitations    Diagonals Limitations Blue XTS    Other Standing Exercises --      Shoulder Exercises: Pulleys   Flexion 5 minutes      Shoulder Exercises: ROM/Strengthening   UBE (Upper Arm Bike) 60 RPM x8 mins    Wall Wash CW & CCW circles in ~120 degrees flexion x1 min each      Modalities   Modalities Vasopneumatic      Vasopneumatic   Number Minutes Vasopneumatic  10 minutes    Vasopnuematic Location  Shoulder    Vasopneumatic Pressure Low    Vasopneumatic Temperature  34                       PT Long Term Goals - 05/19/20 0801      PT LONG TERM GOAL #1   Title Patient will be independent with HEP    Time 6    Period Weeks     Status Achieved      PT LONG TERM GOAL #2   Title Patient will demonstrate 140+ degrees of left shoulder flexion AROM to improve ability to perform overhead tasks.    Time 6    Period Weeks    Status Achieved   but with compensatory motions achieve     PT LONG TERM GOAL #3   Title Patient will demonstrate 55+ degrees of left shoulder ER AROM to improve donning and doffing apparel.    Time 6    Period Weeks    Status Achieved      PT LONG TERM GOAL #4   Title Patient will demonstrate 4/5 or greater left shoulder MMT in all planes to improve stability during functional tasks.    Time 6    Period Weeks    Status On-going      PT LONG TERM GOAL #5   Title Patient will be independent with ADLs and with pain less than or equal to 2/10 in left shoulder.    Time 6    Period Weeks    Status On-going   average 5-6/43 with certain activities.                Plan - 05/19/20 0814    Clinical Impression Statement Patient able to complete session but with slight increase of pain. Patient able to complete TEs but with ongoing discomfort particularly in flexion ranges from 90-130 degrees. Patient has strong carryover of cuing for proper form and technique. Patient noted with ongoing fatigue as he requires rest breaks to maintain form. See goals and measurements. No adverse affects upon removal of modalities.    Personal Factors and Comorbidities Age;Comorbidity 1    Comorbidities left RTC repair 12/10/2019 (23 weeks post op 05/19/2020)    Examination-Activity Limitations Bathing;Reach Overhead;Carry;Dressing;Hygiene/Grooming;Lift    Stability/Clinical Decision Making Stable/Uncomplicated    Clinical Decision Making Low    Rehab Potential Excellent    PT Frequency 3x / week    PT Duration 6 weeks    PT Treatment/Interventions ADLs/Self Care Home Management;Cryotherapy;Electrical Stimulation;Moist Heat;Ultrasound;Neuromuscular re-education;Therapeutic activities;Therapeutic exercise;Functional  mobility training;Patient/family education;Manual techniques;Vasopneumatic Device;Taping;Passive range of motion    PT Next Visit Plan Continue stabilization and periscapular exercises for strength and function    PT Home Exercise Plan see patient education section    Consulted and  Agree with Plan of Care Patient           Patient will benefit from skilled therapeutic intervention in order to improve the following deficits and impairments:  Decreased activity tolerance, Decreased strength, Decreased range of motion, Pain, Impaired UE functional use, Postural dysfunction  Visit Diagnosis: Muscle weakness (generalized)  Stiffness of left shoulder, not elsewhere classified     Problem List There are no problems to display for this patient.   Guss Bunde, PT, DPT 05/19/2020, 9:09 AM  [facility] [address] [place], [place], [address] Phone: [phone]   Fax:  [phone]  Name: Jamarius Saha MRN: [id number] Date of Birth: August 22, 1956

## 2020-05-21 ENCOUNTER — Ambulatory Visit: Payer: Worker's Compensation | Admitting: Physical Therapy

## 2020-05-21 ENCOUNTER — Other Ambulatory Visit: Payer: Self-pay

## 2020-05-21 ENCOUNTER — Encounter: Payer: Self-pay | Admitting: Physical Therapy

## 2020-05-21 DIAGNOSIS — M6281 Muscle weakness (generalized): Secondary | ICD-10-CM

## 2020-05-21 DIAGNOSIS — M25612 Stiffness of left shoulder, not elsewhere classified: Secondary | ICD-10-CM

## 2020-05-21 NOTE — Therapy (Signed)
[facility] [place], [place], [address] Phone: [phone]   Fax:  [phone]  Physical Therapy Treatment  Patient Details  Name: Alvin Sanchez MRN: [id number] Date of Birth: [birth year] Referring Provider (PT): Tania Ade, MD   Encounter Date: 05/21/2020   PT End of Session - 05/21/20 0747    Visit Number 42    Number of Visits 50    Date for PT Re-Evaluation 06/17/20    Authorization Type Worker's compensation 34 approved visits    Authorization - Visit Number 65    Authorization - Number of Visits 64    PT Start Time 0731    PT Stop Time 0822    PT Time Calculation (min) 51 min    Activity Tolerance Patient tolerated treatment well    Behavior During Therapy [facility] for tasks assessed/performed           Past Medical History:  Diagnosis Date  . Anxiety   . Kidney stones     Past Surgical History:  Procedure Laterality Date  . FRACTURE SURGERY     toe  . ROTATOR CUFF REPAIR Left 12/10/2019  . THYROIDECTOMY Right 02/04/2011   Dr. Hardie Lora, [facility]    There were no vitals filed for this visit.   Subjective Assessment - 05/21/20 0745    Subjective COVID-19 screening performed upon arrival. Reports MD appointment went well and stated to continue strengthening. Approved for 2x per week for 4 additional weeks then 06/17/20 follow up visit.    Pertinent History left RTC repair 12/10/2019    Limitations Lifting;House hold activities    Patient Stated Goals use arm normally again.    Currently in Pain? No/denies              [facility] PT Assessment - 05/21/20 0001      Assessment   Medical Diagnosis Traumatic rotator cuff tear left; shoulder impingement, left    Referring Provider (PT) Tania Ade, MD    Onset Date/Surgical Date 12/10/19    Hand Dominance Right    Next MD Visit 05/20/2020    Prior Therapy no      Precautions   Precautions Shoulder    Precaution Comments Follow RTC  protocol see media                         [facility] Adult PT Treatment/Exercise - 05/21/20 0001      Exercises   Exercises Shoulder      Shoulder Exercises: Supine   Protraction AROM;Left;20 reps;10 reps    Protraction Weight (lbs) 1    Other Supine Exercises circles CW, CCW with shoulder protracted x20 each      Shoulder Exercises: Prone   Retraction Strengthening;Left;20 reps;10 reps;Weights    Retraction Weight (lbs) 4    Extension AROM;Left;20 reps;10 reps    Extension Weight (lbs) 4      Shoulder Exercises: Sidelying   External Rotation AROM;Left;20 reps;10 reps    External Rotation Weight (lbs) 1      Shoulder Exercises: Standing   Protraction Strengthening;Left;Theraband;10 reps;20 reps    Theraband Level (Shoulder Protraction) Level 1 (Yellow)    External Rotation Strengthening;10 reps;AAROM;20 reps    Theraband Level (Shoulder External Rotation) Level 1 (Yellow)    External Rotation Limitations step outs    Internal Rotation Strengthening;Left;Theraband;20 reps;10 reps    Theraband Level (Shoulder Internal Rotation) Level 2 (Red)    Extension Strengthening;Both;10 reps;20 reps  Theraband Level (Shoulder Extension) Level 2 (Red)    Row Strengthening;20 reps;10 reps    Theraband Level (Shoulder Row) Level 2 (Red)      Shoulder Exercises: Pulleys   Flexion 5 minutes      Shoulder Exercises: ROM/Strengthening   UBE (Upper Arm Bike) 30 RPM x8 mins      Vasopneumatic   Number Minutes Vasopneumatic  10 minutes    Vasopnuematic Location  Shoulder    Vasopneumatic Pressure Low    Vasopneumatic Temperature  34                       PT Long Term Goals - 05/19/20 0801      PT LONG TERM GOAL #1   Title Patient will be independent with HEP    Time 6    Period Weeks    Status Achieved      PT LONG TERM GOAL #2   Title Patient will demonstrate 140+ degrees of left shoulder flexion AROM to improve ability to perform overhead tasks.     Time 6    Period Weeks    Status Achieved   but with compensatory motions achieve     PT LONG TERM GOAL #3   Title Patient will demonstrate 55+ degrees of left shoulder ER AROM to improve donning and doffing apparel.    Time 6    Period Weeks    Status Achieved      PT LONG TERM GOAL #4   Title Patient will demonstrate 4/5 or greater left shoulder MMT in all planes to improve stability during functional tasks.    Time 6    Period Weeks    Status On-going      PT LONG TERM GOAL #5   Title Patient will be independent with ADLs and with pain less than or equal to 2/10 in left shoulder.    Time 6    Period Weeks    Status On-going   average 3-4/10 with certain activities.                Plan - 05/21/20 0805    Clinical Impression Statement Patient responded fairly well to therapy session. Patient able to progress to an additional set of all exercises with minimal complaints of pain and rest breaks due to fatigue. No adverse affects upon removal of modalities.    Personal Factors and Comorbidities Age;Comorbidity 1    Comorbidities left RTC repair 12/10/2019 (23 weeks post op 05/19/2020)    Examination-Activity Limitations Bathing;Reach Overhead;Carry;Dressing;Hygiene/Grooming;Lift    Stability/Clinical Decision Making Stable/Uncomplicated    Clinical Decision Making Low    Rehab Potential Excellent    PT Frequency 3x / week    PT Duration 6 weeks    PT Treatment/Interventions ADLs/Self Care Home Management;Cryotherapy;Electrical Stimulation;Moist Heat;Ultrasound;Neuromuscular re-education;Therapeutic activities;Therapeutic exercise;Functional mobility training;Patient/family education;Manual techniques;Vasopneumatic Device;Taping;Passive range of motion    PT Next Visit Plan Continue strengthening, stabilization and periscapular exercises for strength and function    PT Home Exercise Plan see patient education section    Consulted and Agree with Plan of Care Patient            Patient will benefit from skilled therapeutic intervention in order to improve the following deficits and impairments:  Decreased activity tolerance, Decreased strength, Decreased range of motion, Pain, Impaired UE functional use, Postural dysfunction  Visit Diagnosis: Muscle weakness (generalized)  Stiffness of left shoulder, not elsewhere classified     Problem List There are no  problems to display for this patient.   Alvin Sanchez 05/21/2020, 8:25 AM  [facility] [address] [place], [place], [address] Phone: [phone]   Fax:  [phone]  Name: Alvin Sanchez MRN: [id number] Date of Birth: [birth year]

## 2020-05-26 ENCOUNTER — Other Ambulatory Visit: Payer: Self-pay

## 2020-05-26 ENCOUNTER — Encounter: Payer: Self-pay | Admitting: Physical Therapy

## 2020-05-26 ENCOUNTER — Ambulatory Visit: Payer: Worker's Compensation | Admitting: Physical Therapy

## 2020-05-26 DIAGNOSIS — M6281 Muscle weakness (generalized): Secondary | ICD-10-CM

## 2020-05-26 DIAGNOSIS — M25612 Stiffness of left shoulder, not elsewhere classified: Secondary | ICD-10-CM

## 2020-05-26 NOTE — Therapy (Signed)
[facility] [place], [place], [address] Phone: [phone]   Fax:  [phone]  Physical Therapy Treatment  Patient Details  Name: Alvin Sanchez MRN: [id number] Date of Birth: April 02, 1956 Referring Provider (PT): Tania Ade, MD   Encounter Date: 05/26/2020   PT End of Session - 05/26/20 0734    Visit Number 43    Number of Visits 50    Date for PT Re-Evaluation 06/17/20    Authorization Type Worker's compensation 16 approved visits    Authorization - Visit Number 68    Authorization - Number of Visits 50    PT Start Time 0730    PT Stop Time 0822    PT Time Calculation (min) 52 min    Activity Tolerance Patient tolerated treatment well    Behavior During Therapy [facility] for tasks assessed/performed           Past Medical History:  Diagnosis Date  . Anxiety   . Kidney stones     Past Surgical History:  Procedure Laterality Date  . FRACTURE SURGERY     toe  . ROTATOR CUFF REPAIR Left 12/10/2019  . THYROIDECTOMY Right 02/04/2011   Dr. Hardie Lora, [facility]    There were no vitals filed for this visit.   Subjective Assessment - 05/26/20 0733    Subjective COVID-19 screening performed upon arrival. Patient arrives with no new complaints.    Pertinent History left RTC repair 12/10/2019    Limitations Lifting;House hold activities    Patient Stated Goals use arm normally again.    Currently in Pain? No/denies              [facility] PT Assessment - 05/26/20 0001      Assessment   Medical Diagnosis Traumatic rotator cuff tear left; shoulder impingement, left    Referring Provider (PT) Tania Ade, MD    Onset Date/Surgical Date 12/10/19    Hand Dominance Right    Next MD Visit 05/20/2020    Prior Therapy no      Precautions   Precautions Shoulder    Precaution Comments Follow RTC protocol see media                         [facility] Adult PT Treatment/Exercise - 05/26/20 0001        Exercises   Exercises Shoulder      Shoulder Exercises: Supine   Protraction Left;20 reps;10 reps;Strengthening    Protraction Weight (lbs) 3    Other Supine Exercises circles CW, CCW with shoulder protracted 3# x20 each      Shoulder Exercises: Prone   Retraction Strengthening;Left;20 reps;10 reps;Weights    Retraction Weight (lbs) 4    Extension Strengthening;Left;10 reps;20 reps;Weights    Extension Weight (lbs) 4      Shoulder Exercises: Standing   Flexion Strengthening;20 reps    Shoulder Flexion Weight (lbs) 4    Flexion Limitations Both hands holding 4# to middle shelf x30      Shoulder Exercises: Pulleys   Flexion 5 minutes      Shoulder Exercises: ROM/Strengthening   UBE (Upper Arm Bike) 30 RPM x8 mins    Ranger standing ranger flexion, CW, CCW at 120 degrees flexion x3 minutes each      Modalities   Modalities Vasopneumatic      Vasopneumatic   Number Minutes Vasopneumatic  10 minutes    Vasopnuematic Location  Shoulder    Vasopneumatic Pressure Low  Vasopneumatic Temperature  34                       PT Long Term Goals - 05/19/20 0801      PT LONG TERM GOAL #1   Title Patient will be independent with HEP    Time 6    Period Weeks    Status Achieved      PT LONG TERM GOAL #2   Title Patient will demonstrate 140+ degrees of left shoulder flexion AROM to improve ability to perform overhead tasks.    Time 6    Period Weeks    Status Achieved   but with compensatory motions achieve     PT LONG TERM GOAL #3   Title Patient will demonstrate 55+ degrees of left shoulder ER AROM to improve donning and doffing apparel.    Time 6    Period Weeks    Status Achieved      PT LONG TERM GOAL #4   Title Patient will demonstrate 4/5 or greater left shoulder MMT in all planes to improve stability during functional tasks.    Time 6    Period Weeks    Status On-going      PT LONG TERM GOAL #5   Title Patient will be independent with ADLs and  with pain less than or equal to 2/10 in left shoulder.    Time 6    Period Weeks    Status On-going   average 3-4/10 with certain activities.                Plan - 05/26/20 0804    Clinical Impression Statement Patient responded well to therapy session but with ongoing fatigue. Patient able to complete exercises but with rest breaks. Patient and PT discussed slowly adding exercises that would transition him into returning to work. Patient educated on strengthening muscles that are supporting the other muscles that were unable to be reattached. Patient reported understanding. No adverse affects upon removal of modalities.    Personal Factors and Comorbidities Age;Comorbidity 1    Comorbidities left RTC repair 12/10/2019 (23 weeks post op 05/19/2020)    Examination-Activity Limitations Bathing;Reach Overhead;Carry;Dressing;Hygiene/Grooming;Lift    Stability/Clinical Decision Making Stable/Uncomplicated    Clinical Decision Making Low    Rehab Potential Excellent    PT Frequency 3x / week    PT Duration 6 weeks    PT Treatment/Interventions ADLs/Self Care Home Management;Cryotherapy;Electrical Stimulation;Moist Heat;Ultrasound;Neuromuscular re-education;Therapeutic activities;Therapeutic exercise;Functional mobility training;Patient/family education;Manual techniques;Vasopneumatic Device;Taping;Passive range of motion    PT Next Visit Plan Initiate more carrying and lifting exercises. Continue strengthening, stabilization and periscapular exercises for strength and function    PT Home Exercise Plan see patient education section    Consulted and Agree with Plan of Care Patient           Patient will benefit from skilled therapeutic intervention in order to improve the following deficits and impairments:  Decreased activity tolerance, Decreased strength, Decreased range of motion, Pain, Impaired UE functional use, Postural dysfunction  Visit Diagnosis: Muscle weakness  (generalized)  Stiffness of left shoulder, not elsewhere classified     Problem List There are no problems to display for this patient.   Guss Bunde, PT, DPT 05/26/2020, 8:25 AM  [facility] [address] [place], [place], [address] Phone: [phone]   Fax:  [phone]  Name: Montrice Gracey MRN: [id number] Date of Birth: Oct 02, 1956

## 2020-05-28 ENCOUNTER — Ambulatory Visit: Payer: Worker's Compensation | Admitting: Physical Therapy

## 2020-05-28 ENCOUNTER — Other Ambulatory Visit: Payer: Self-pay

## 2020-05-28 ENCOUNTER — Encounter: Payer: Self-pay | Admitting: Physical Therapy

## 2020-05-28 DIAGNOSIS — M6281 Muscle weakness (generalized): Secondary | ICD-10-CM

## 2020-05-28 DIAGNOSIS — M25612 Stiffness of left shoulder, not elsewhere classified: Secondary | ICD-10-CM

## 2020-05-28 NOTE — Therapy (Signed)
[facility] [address] [place], [place], [address] Phone: [phone]   Fax:  [phone]  Physical Therapy Treatment  Patient Details  Name: Alvin Sanchez MRN: [id number] Date of Birth: 03-02-1956 Referring Provider (PT): Jones Broom, MD   Encounter Date: 05/28/2020   PT End of Session - 05/28/20 0754    Visit Number 44    Number of Visits 50    Date for PT Re-Evaluation 06/17/20    Authorization Type Worker's compensation 50 approved visits    Authorization - Visit Number 44    Authorization - Number of Visits 50    PT Start Time [phone]    Activity Tolerance Patient tolerated treatment well    Behavior During Therapy [facility] for tasks assessed/performed           Past Medical History:  Diagnosis Date  . Anxiety   . Kidney stones     Past Surgical History:  Procedure Laterality Date  . FRACTURE SURGERY     toe  . ROTATOR CUFF REPAIR Left 12/10/2019  . THYROIDECTOMY Right 02/04/2011   Dr. Sandi Carne, [facility]    There were no vitals filed for this visit.   Subjective Assessment - 05/28/20 0733    Subjective COVID-19 screening performed upon arrival. Patient reports feeling more sore after last session but overall doing well.    Pertinent History left RTC repair 12/10/2019    Limitations Lifting;House hold activities    Patient Stated Goals use arm normally again.    Currently in Pain? Yes    Pain Score 3     Pain Location Shoulder    Pain Orientation Left    Pain Descriptors / Indicators Sore    Pain Type Surgical pain    Pain Onset More than a month ago    Pain Frequency Occasional              [facility] PT Assessment - 05/28/20 0001      Assessment   Medical Diagnosis Traumatic rotator cuff tear left; shoulder impingement, left    Referring Provider (PT) Jones Broom, MD    Onset Date/Surgical Date 12/10/19    Hand Dominance Right    Next MD Visit 05/20/2020    Prior Therapy no       Precautions   Precautions Shoulder    Precaution Comments Follow RTC protocol see media                         [facility] Adult PT Treatment/Exercise - 05/28/20 0001      Exercises   Exercises Shoulder      Shoulder Exercises: Standing   Protraction Strengthening;Left;Theraband;10 reps;20 reps    Theraband Level (Shoulder Protraction) Level 1 (Yellow)    External Rotation Strengthening;10 reps;AAROM;20 reps    Theraband Level (Shoulder External Rotation) Level 1 (Yellow)    External Rotation Limitations step outs    Internal Rotation Strengthening;Left;Theraband;20 reps;10 reps    Theraband Level (Shoulder Internal Rotation) Level 3 (Green)    Extension Strengthening;Both;10 reps;20 reps    Theraband Level (Shoulder Extension) Level 3 (Green)    Row Strengthening;20 reps;10 reps    Theraband Level (Shoulder Row) Level 2 (Red)    Other Standing Exercises 4# bicep curl with scap squeeze x30 reps      Shoulder Exercises: Pulleys   Flexion 5 minutes      Shoulder Exercises: ROM/Strengthening   UBE (Upper Arm Bike) 30 RPM x8 mins  Ranger standing ranger flexion, CW, CCW at 120 degrees flexion x3 minutes each      Modalities   Modalities Vasopneumatic      Vasopneumatic   Number Minutes Vasopneumatic  10 minutes    Vasopnuematic Location  Shoulder    Vasopneumatic Pressure Low    Vasopneumatic Temperature  34                       PT Long Term Goals - 05/19/20 0801      PT LONG TERM GOAL #1   Title Patient will be independent with HEP    Time 6    Period Weeks    Status Achieved      PT LONG TERM GOAL #2   Title Patient will demonstrate 140+ degrees of left shoulder flexion AROM to improve ability to perform overhead tasks.    Time 6    Period Weeks    Status Achieved   but with compensatory motions achieve     PT LONG TERM GOAL #3   Title Patient will demonstrate 55+ degrees of left shoulder ER AROM to improve donning and doffing apparel.     Time 6    Period Weeks    Status Achieved      PT LONG TERM GOAL #4   Title Patient will demonstrate 4/5 or greater left shoulder MMT in all planes to improve stability during functional tasks.    Time 6    Period Weeks    Status On-going      PT LONG TERM GOAL #5   Title Patient will be independent with ADLs and with pain less than or equal to 2/10 in left shoulder.    Time 6    Period Weeks    Status On-going   average 2-9/51 with certain activities.                Plan - 05/28/20 0803    Clinical Impression Statement Patient responded well to therapy session with progression of therabands. Patient instructed to rest as needed to prevent UT compensations to which patient reported understanding. Patient still requiring verbal cuing to prevent trunk rotation with rows but able to complete with no rotation. No adverse affects upon removal of modalities.    Personal Factors and Comorbidities Age;Comorbidity 1    Comorbidities left RTC repair 12/10/2019 (23 weeks post op 05/19/2020)    Examination-Activity Limitations Bathing;Reach Overhead;Carry;Dressing;Hygiene/Grooming;Lift    Stability/Clinical Decision Making Stable/Uncomplicated    Clinical Decision Making Low    Rehab Potential Excellent    PT Frequency 3x / week    PT Duration 6 weeks    PT Treatment/Interventions ADLs/Self Care Home Management;Cryotherapy;Electrical Stimulation;Moist Heat;Ultrasound;Neuromuscular re-education;Therapeutic activities;Therapeutic exercise;Functional mobility training;Patient/family education;Manual techniques;Vasopneumatic Device;Taping;Passive range of motion    PT Next Visit Plan Initiate more carrying and lifting exercises. Continue strengthening, stabilization and periscapular exercises for strength and function    PT Home Exercise Plan see patient education section    Consulted and Agree with Plan of Care Patient           Patient will benefit from skilled therapeutic intervention  in order to improve the following deficits and impairments:  Decreased activity tolerance, Decreased strength, Decreased range of motion, Pain, Impaired UE functional use, Postural dysfunction  Visit Diagnosis: Muscle weakness (generalized)  Stiffness of left shoulder, not elsewhere classified     Problem List There are no problems to display for this patient.   [name], PT, DPT  05/28/2020, 8:56 AM  [facility] [address] [place], [place], [address] Phone: [phone]   Fax:  [phone]  Name: Alvin Sanchez MRN: [id number] Date of Birth: 24-Sep-1956

## 2020-06-02 ENCOUNTER — Ambulatory Visit: Payer: Worker's Compensation | Admitting: Physical Therapy

## 2020-06-02 ENCOUNTER — Other Ambulatory Visit: Payer: Self-pay

## 2020-06-02 DIAGNOSIS — M25612 Stiffness of left shoulder, not elsewhere classified: Secondary | ICD-10-CM | POA: Diagnosis not present

## 2020-06-02 DIAGNOSIS — M6281 Muscle weakness (generalized): Secondary | ICD-10-CM

## 2020-06-02 NOTE — Therapy (Signed)
[facility] [address] [place], [place], [address] Phone: [phone]   Fax:  [phone]  Physical Therapy Treatment  Patient Details  Name: Alvin Sanchez MRN: [id number] Date of Birth: 03-18-1956 Referring Provider (PT): Jones Broom, MD   Encounter Date: 06/02/2020   PT End of Session - 06/02/20 0807    Visit Number 45    Number of Visits 50    Date for PT Re-Evaluation 06/17/20    Authorization Type Worker's compensation 50 approved visits    Authorization - Visit Number 45    Authorization - Number of Visits 50    PT Start Time 0730    PT Stop Time 0814    PT Time Calculation (min) 44 min    Activity Tolerance Patient tolerated treatment well    Behavior During Therapy [facility] for tasks assessed/performed           Past Medical History:  Diagnosis Date   Anxiety    Kidney stones     Past Surgical History:  Procedure Laterality Date   FRACTURE SURGERY     toe   ROTATOR CUFF REPAIR Left 12/10/2019   THYROIDECTOMY Right 02/04/2011   Dr. Sandi Carne, [facility]    There were no vitals filed for this visit.   Subjective Assessment - 06/02/20 0741    Subjective COVID-19 screening performed upon arrival. Some soreness.    Pertinent History left RTC repair 12/10/2019    Limitations Lifting;House hold activities    Patient Stated Goals use arm normally again.    Currently in Pain? Yes    Pain Score 3     Pain Orientation Left    Pain Descriptors / Indicators Sore    Pain Type Surgical pain    Pain Onset More than a month ago                             [facility] Adult PT Treatment/Exercise - 06/02/20 0001      Exercises   Exercises Shoulder      Shoulder Exercises: Sidelying   Other Sidelying Exercises Assisted [facility] ER to fatigue.      Shoulder Exercises: Pulleys   Flexion 5 minutes    Other Pulley Exercises Wall ladder x 5 minutes.      Shoulder Exercises: ROM/Strengthening   UBE  (Upper Arm Bike) 90 RPM's x 8 minutes.    Ranger Standing into flexion, and circle CW and CCW x 5 minutes.      Modalities   Modalities Vasopneumatic      Vasopneumatic   Number Minutes Vasopneumatic  15 minutes    Vasopnuematic Location  --   Left shoulder.   Vasopneumatic Pressure Low                       PT Long Term Goals - 05/19/20 0801      PT LONG TERM GOAL #1   Title Patient will be independent with HEP    Time 6    Period Weeks    Status Achieved      PT LONG TERM GOAL #2   Title Patient will demonstrate 140+ degrees of left shoulder flexion AROM to improve ability to perform overhead tasks.    Time 6    Period Weeks    Status Achieved   but with compensatory motions achieve     PT LONG TERM GOAL #3   Title Patient will  demonstrate 55+ degrees of left shoulder ER AROM to improve donning and doffing apparel.    Time 6    Period Weeks    Status Achieved      PT LONG TERM GOAL #4   Title Patient will demonstrate 4/5 or greater left shoulder MMT in all planes to improve stability during functional tasks.    Time 6    Period Weeks    Status On-going      PT LONG TERM GOAL #5   Title Patient will be independent with ADLs and with pain less than or equal to 2/10 in left shoulder.    Time 6    Period Weeks    Status On-going   average 3-4/10 with certain activities.                Plan - 06/02/20 0806    Clinical Impression Statement Patient reporting some soreness and continued left shoulder weakness.  He did well with sdly ER and min assist.    Personal Factors and Comorbidities Age;Comorbidity 1    Comorbidities left RTC repair 12/10/2019 (23 weeks post op 05/19/2020)    Examination-Activity Limitations Bathing;Reach Overhead;Carry;Dressing;Hygiene/Grooming;Lift    Stability/Clinical Decision Making Stable/Uncomplicated    Rehab Potential Excellent    PT Frequency 3x / week    PT Duration 6 weeks    PT Treatment/Interventions ADLs/Self  Care Home Management;Cryotherapy;Electrical Stimulation;Moist Heat;Ultrasound;Neuromuscular re-education;Therapeutic activities;Therapeutic exercise;Functional mobility training;Patient/family education;Manual techniques;Vasopneumatic Device;Taping;Passive range of motion    PT Next Visit Plan Initiate more carrying and lifting exercises. Continue strengthening, stabilization and periscapular exercises for strength and function    PT Home Exercise Plan see patient education section    Consulted and Agree with Plan of Care Patient           Patient will benefit from skilled therapeutic intervention in order to improve the following deficits and impairments:  Decreased activity tolerance, Decreased strength, Decreased range of motion, Pain, Impaired UE functional use, Postural dysfunction  Visit Diagnosis: Muscle weakness (generalized)  Stiffness of left shoulder, not elsewhere classified     Problem List There are no problems to display for this patient.   Drako Maese, [place] MPT 06/02/2020, 8:19 AM  [facility] [address] [place], [place], [address] Phone: [phone]   Fax:  [phone]  Name: Alvin Sanchez MRN: [id number] Date of Birth: 26-Sep-1956

## 2020-06-04 ENCOUNTER — Other Ambulatory Visit: Payer: Self-pay

## 2020-06-04 ENCOUNTER — Ambulatory Visit: Payer: Worker's Compensation | Attending: Orthopedic Surgery | Admitting: Physical Therapy

## 2020-06-04 ENCOUNTER — Encounter: Payer: Self-pay | Admitting: Physical Therapy

## 2020-06-04 DIAGNOSIS — M25612 Stiffness of left shoulder, not elsewhere classified: Secondary | ICD-10-CM | POA: Diagnosis present

## 2020-06-04 DIAGNOSIS — M6281 Muscle weakness (generalized): Secondary | ICD-10-CM | POA: Insufficient documentation

## 2020-06-04 NOTE — Therapy (Signed)
[facility] [address] [place], [place], [address] Phone: [phone]   Fax:  [phone]  Physical Therapy Treatment  Patient Details  Name: Alvin Sanchez MRN: [id number] Date of Birth: [birth year] Referring Provider (PT): Jones Broom, MD   Encounter Date: 06/04/2020   PT End of Session - 06/04/20 0737    Visit Number 46    Number of Visits 50    Date for PT Re-Evaluation 06/17/20    Authorization Type Worker's compensation 50 approved visits    Authorization - Visit Number 46    Authorization - Number of Visits 50    PT Start Time 0732    PT Stop Time 0814    PT Time Calculation (min) 42 min    Activity Tolerance Patient tolerated treatment well    Behavior During Therapy [facility] for tasks assessed/performed           Past Medical History:  Diagnosis Date  . Anxiety   . Kidney stones     Past Surgical History:  Procedure Laterality Date  . FRACTURE SURGERY     toe  . ROTATOR CUFF REPAIR Left 12/10/2019  . THYROIDECTOMY Right 02/04/2011   Dr. Sandi Carne, [facility]    There were no vitals filed for this visit.   Subjective Assessment - 06/04/20 0736    Subjective COVID-19 screening performed upon arrival. Some soreness.    Pertinent History left RTC repair 12/10/2019    Limitations Lifting;House hold activities    Patient Stated Goals use arm normally again.    Currently in Pain? Yes    Pain Score 2     Pain Location Shoulder    Pain Orientation Left    Pain Descriptors / Indicators Sore    Pain Type Surgical pain    Pain Onset More than a month ago    Pain Frequency Intermittent              [facility] PT Assessment - 06/04/20 0001      Assessment   Medical Diagnosis Traumatic rotator cuff tear left; shoulder impingement, left    Referring Provider (PT) Jones Broom, MD    Onset Date/Surgical Date 12/10/19    Hand Dominance Right    Next MD Visit 06/17/2020    Prior Therapy no      Precautions     Precautions Shoulder    Precaution Comments Follow RTC protocol see media                         [facility] Adult PT Treatment/Exercise - 06/04/20 0001      Shoulder Exercises: Prone   Other Prone Exercises LUE AROM scaption, abduction, inverted V x10 reps       Shoulder Exercises: Sidelying   External Rotation Strengthening;Left;15 reps;Weights    External Rotation Weight (lbs) 1    Flexion AROM;Left;15 reps      Shoulder Exercises: Standing   External Rotation Strengthening;Left;20 reps;Theraband    Theraband Level (Shoulder External Rotation) Level 1 (Yellow)    External Rotation Limitations walk outs    Other Standing Exercises wall walks yellow theraband x2 RT    Other Standing Exercises wall wash into scaption x15 reps      Shoulder Exercises: Pulleys   Flexion 5 minutes      Shoulder Exercises: ROM/Strengthening   UBE (Upper Arm Bike) 30 RPM x8 min    Wall Pushups 20 reps      Modalities   Modalities  Vasopneumatic      Vasopneumatic   Number Minutes Vasopneumatic  10 minutes    Vasopnuematic Location  Shoulder    Vasopneumatic Pressure Low    Vasopneumatic Temperature  34                       PT Long Term Goals - 05/19/20 0801      PT LONG TERM GOAL #1   Title Patient will be independent with HEP    Time 6    Period Weeks    Status Achieved      PT LONG TERM GOAL #2   Title Patient will demonstrate 140+ degrees of left shoulder flexion AROM to improve ability to perform overhead tasks.    Time 6    Period Weeks    Status Achieved   but with compensatory motions achieve     PT LONG TERM GOAL #3   Title Patient will demonstrate 55+ degrees of left shoulder ER AROM to improve donning and doffing apparel.    Time 6    Period Weeks    Status Achieved      PT LONG TERM GOAL #4   Title Patient will demonstrate 4/5 or greater left shoulder MMT in all planes to improve stability during functional tasks.    Time 6    Period Weeks     Status On-going      PT LONG TERM GOAL #5   Title Patient will be independent with ADLs and with pain less than or equal to 2/10 in left shoulder.    Time 6    Period Weeks    Status On-going   average 3-4/10 with certain activities.                Plan - 06/04/20 0809    Clinical Impression Statement Primary focus of today's treatment being periscapular strengthening. Patient demonstrated lack of eccentric control in SL flexion and also wall wash scaption. Weakness also still noted with SL ER and ER walk outs with yellow theraband. Patient reported muscle fatigue by the end of the session. Normal vasopnuematic response noted following removal of the modality.    Personal Factors and Comorbidities Age;Comorbidity 1    Comorbidities left RTC repair 12/10/2019 (23 weeks post op 05/19/2020)    Examination-Activity Limitations Bathing;Reach Overhead;Carry;Dressing;Hygiene/Grooming;Lift    Stability/Clinical Decision Making Stable/Uncomplicated    Rehab Potential Excellent    PT Frequency 3x / week    PT Duration 6 weeks    PT Treatment/Interventions ADLs/Self Care Home Management;Cryotherapy;Electrical Stimulation;Moist Heat;Ultrasound;Neuromuscular re-education;Therapeutic activities;Therapeutic exercise;Functional mobility training;Patient/family education;Manual techniques;Vasopneumatic Device;Taping;Passive range of motion    PT Next Visit Plan Initiate more carrying and lifting exercises. Continue strengthening, stabilization and periscapular exercises for strength and function    PT Home Exercise Plan see patient education section    Consulted and Agree with Plan of Care Patient           Patient will benefit from skilled therapeutic intervention in order to improve the following deficits and impairments:  Decreased activity tolerance, Decreased strength, Decreased range of motion, Pain, Impaired UE functional use, Postural dysfunction  Visit Diagnosis: Muscle weakness  (generalized)  Stiffness of left shoulder, not elsewhere classified     Problem List There are no problems to display for this patient.   Marvell Fuller, PTA 06/04/2020, 8:50 AM  [facility] [address] [place], [place], [address] Phone: [phone]   Fax:  [phone]  Name: Alvin Sanchez MRN: [id number] Date of Birth: [birth year]

## 2020-06-09 ENCOUNTER — Ambulatory Visit: Payer: Worker's Compensation | Admitting: Physical Therapy

## 2020-06-09 ENCOUNTER — Other Ambulatory Visit: Payer: Self-pay

## 2020-06-09 ENCOUNTER — Encounter: Payer: Self-pay | Admitting: Physical Therapy

## 2020-06-09 DIAGNOSIS — M6281 Muscle weakness (generalized): Secondary | ICD-10-CM | POA: Diagnosis not present

## 2020-06-09 DIAGNOSIS — M25612 Stiffness of left shoulder, not elsewhere classified: Secondary | ICD-10-CM

## 2020-06-09 NOTE — Therapy (Signed)
[facility] [address] [place], [place], [address] Phone: [phone]   Fax:  [phone]  Physical Therapy Treatment  Patient Details  Name: Alvin Sanchez MRN: [id number] Date of Birth: Feb 03, 1956 Referring Provider (PT): Jones Broom, MD   Encounter Date: 06/09/2020   PT End of Session - 06/09/20 0742    Visit Number 47    Number of Visits 50    Date for PT Re-Evaluation 06/17/20    Authorization Type Worker's compensation 50 approved visits    Authorization - Visit Number 47    Authorization - Number of Visits 50    PT Start Time 0731    PT Stop Time 0815    PT Time Calculation (min) 44 min    Activity Tolerance Patient tolerated treatment well    Behavior During Therapy [facility] for tasks assessed/performed           Past Medical History:  Diagnosis Date  . Anxiety   . Kidney stones     Past Surgical History:  Procedure Laterality Date  . FRACTURE SURGERY     toe  . ROTATOR CUFF REPAIR Left 12/10/2019  . THYROIDECTOMY Right 02/04/2011   Dr. Sandi Carne, [facility]    There were no vitals filed for this visit.   Subjective Assessment - 06/09/20 0737    Subjective COVID-19 screening performed upon arrival. No new complaints.    Pertinent History left RTC repair 12/10/2019    Limitations Lifting;House hold activities    Patient Stated Goals use arm normally again.    Currently in Pain? No/denies              [facility] PT Assessment - 06/09/20 0001      Assessment   Medical Diagnosis Traumatic rotator cuff tear left; shoulder impingement, left    Referring Provider (PT) Jones Broom, MD    Onset Date/Surgical Date 12/10/19    Hand Dominance Right    Next MD Visit 06/17/2020    Prior Therapy no      Precautions   Precautions Shoulder    Precaution Comments Follow RTC protocol see media                         [facility] Adult PT Treatment/Exercise - 06/09/20 0001      Shoulder Exercises:  Prone   Retraction Strengthening;Left;20 reps;Weights    Retraction Weight (lbs) 4    Extension Strengthening;Left;20 reps    Extension Weight (lbs) 4    Other Prone Exercises LUE AROM scaption, abduction, inverted V x20 reps       Shoulder Exercises: Sidelying   External Rotation AROM;Left;20 reps    Flexion AROM;Left;20 reps      Shoulder Exercises: Pulleys   Flexion 5 minutes      Shoulder Exercises: ROM/Strengthening   UBE (Upper Arm Bike) 30 RPM x8 min    Wall Pushups 20 reps;10 reps    Other ROM/Strengthening Exercises wall walks yellow tied band x2 RT    Other ROM/Strengthening Exercises 12-8 wall clock 2# x3 RT      Modalities   Modalities Vasopneumatic      Vasopneumatic   Number Minutes Vasopneumatic  10 minutes    Vasopnuematic Location  Shoulder    Vasopneumatic Pressure Low    Vasopneumatic Temperature  34                       PT Long Term Goals - 05/19/20  0801      PT LONG TERM GOAL #1   Title Patient will be independent with HEP    Time 6    Period Weeks    Status Achieved      PT LONG TERM GOAL #2   Title Patient will demonstrate 140+ degrees of left shoulder flexion AROM to improve ability to perform overhead tasks.    Time 6    Period Weeks    Status Achieved   but with compensatory motions achieve     PT LONG TERM GOAL #3   Title Patient will demonstrate 55+ degrees of left shoulder ER AROM to improve donning and doffing apparel.    Time 6    Period Weeks    Status Achieved      PT LONG TERM GOAL #4   Title Patient will demonstrate 4/5 or greater left shoulder MMT in all planes to improve stability during functional tasks.    Time 6    Period Weeks    Status On-going      PT LONG TERM GOAL #5   Title Patient will be independent with ADLs and with pain less than or equal to 2/10 in left shoulder.    Time 6    Period Weeks    Status On-going   average 3-4/10 with certain activities.                Plan - 06/09/20  0924    Clinical Impression Statement Patient guided through continued periscapular strengthening with resistance and actively to return to functionability. Patient fatigues quickly especially in standing and with abduction. More difficulty noted with yellow theraband tied during wall walks. Normal vasopnuematic response noted following removal of the modality.    Personal Factors and Comorbidities Age;Comorbidity 1    Comorbidities left RTC repair 12/10/2019 (23 weeks post op 05/19/2020)    Examination-Activity Limitations Bathing;Reach Overhead;Carry;Dressing;Hygiene/Grooming;Lift    Stability/Clinical Decision Making Stable/Uncomplicated    Rehab Potential Excellent    PT Frequency 3x / week    PT Duration 6 weeks    PT Treatment/Interventions ADLs/Self Care Home Management;Cryotherapy;Electrical Stimulation;Moist Heat;Ultrasound;Neuromuscular re-education;Therapeutic activities;Therapeutic exercise;Functional mobility training;Patient/family education;Manual techniques;Vasopneumatic Device;Taping;Passive range of motion    PT Next Visit Plan Initiate more carrying and lifting exercises. Continue strengthening, stabilization and periscapular exercises for strength and function    PT Home Exercise Plan see patient education section    Consulted and Agree with Plan of Care Patient           Patient will benefit from skilled therapeutic intervention in order to improve the following deficits and impairments:  Decreased activity tolerance, Decreased strength, Decreased range of motion, Pain, Impaired UE functional use, Postural dysfunction  Visit Diagnosis: Muscle weakness (generalized)  Stiffness of left shoulder, not elsewhere classified     Problem List There are no problems to display for this patient.   Marvell Fuller, PTA 06/09/2020, 9:26 AM  [facility] [address] [place], [place], [address] Phone: [phone]   Fax:   [phone]  Name: Alvin Sanchez MRN: [id number] Date of Birth: 15-Jun-1956

## 2020-06-11 ENCOUNTER — Encounter: Payer: Self-pay | Admitting: Physical Therapy

## 2020-06-11 ENCOUNTER — Other Ambulatory Visit: Payer: Self-pay

## 2020-06-11 ENCOUNTER — Ambulatory Visit: Payer: Worker's Compensation | Admitting: Physical Therapy

## 2020-06-11 DIAGNOSIS — M25612 Stiffness of left shoulder, not elsewhere classified: Secondary | ICD-10-CM

## 2020-06-11 DIAGNOSIS — M6281 Muscle weakness (generalized): Secondary | ICD-10-CM | POA: Diagnosis not present

## 2020-06-11 NOTE — Therapy (Signed)
[facility] [address] [place], [place], [address] Phone: [phone]   Fax:  [phone]  Physical Therapy Treatment  Patient Details  Name: Alvin Sanchez MRN: [id number] Date of Birth: Sep 28, 1956 Referring Provider (PT): Jones Broom, MD   Encounter Date: 06/11/2020   PT End of Session - 06/11/20 0743    Visit Number 48    Number of Visits 50    Date for PT Re-Evaluation 06/17/20    Authorization Type Worker's compensation 50 approved visits    Authorization - Visit Number 48    Authorization - Number of Visits 50    PT Start Time 0734    PT Stop Time 0816    PT Time Calculation (min) 42 min    Activity Tolerance Patient tolerated treatment well    Behavior During Therapy [facility] for tasks assessed/performed           Past Medical History:  Diagnosis Date  . Anxiety   . Kidney stones     Past Surgical History:  Procedure Laterality Date  . FRACTURE SURGERY     toe  . ROTATOR CUFF REPAIR Left 12/10/2019  . THYROIDECTOMY Right 02/04/2011   Dr. Sandi Carne, [facility]    There were no vitals filed for this visit.   Subjective Assessment - 06/11/20 0742    Subjective COVID-19 screening performed upon arrival. No new complaints.    Pertinent History left RTC repair 12/10/2019    Limitations Lifting;House hold activities    Patient Stated Goals use arm normally again.    Currently in Pain? No/denies              [facility] PT Assessment - 06/11/20 0001      Assessment   Medical Diagnosis Traumatic rotator cuff tear left; shoulder impingement, left    Referring Provider (PT) Jones Broom, MD    Onset Date/Surgical Date 12/10/19    Hand Dominance Right    Next MD Visit 06/17/2020    Prior Therapy no      Precautions   Precautions Shoulder    Precaution Comments Follow RTC protocol see media                         [facility] Adult PT Treatment/Exercise - 06/11/20 0001      Shoulder Exercises:  Prone   Retraction Strengthening;Left;20 reps;Weights;10 reps    Retraction Weight (lbs) 4    Extension Strengthening;Left;20 reps;10 reps    Extension Weight (lbs) 4    Horizontal ABduction 1 AROM;Left;20 reps      Shoulder Exercises: Standing   Extension Strengthening;Both;20 reps    Extension Limitations Blue XTS    Row Strengthening;Both;20 reps    Row Limitations Blue XTS    Other Standing Exercises Chop/lift B blue XTS x20 reps each      Shoulder Exercises: Pulleys   Flexion 5 minutes      Shoulder Exercises: ROM/Strengthening   UBE (Upper Arm Bike) 30 RPM x8 min    Wall Pushups 20 reps;10 reps    Other ROM/Strengthening Exercises wall walks yellow untied band x2 RT      Modalities   Modalities Vasopneumatic      Vasopneumatic   Number Minutes Vasopneumatic  10 minutes    Vasopnuematic Location  Shoulder    Vasopneumatic Pressure Low    Vasopneumatic Temperature  34  PT Long Term Goals - 05/19/20 0801      PT LONG TERM GOAL #1   Title Patient will be independent with HEP    Time 6    Period Weeks    Status Achieved      PT LONG TERM GOAL #2   Title Patient will demonstrate 140+ degrees of left shoulder flexion AROM to improve ability to perform overhead tasks.    Time 6    Period Weeks    Status Achieved   but with compensatory motions achieve     PT LONG TERM GOAL #3   Title Patient will demonstrate 55+ degrees of left shoulder ER AROM to improve donning and doffing apparel.    Time 6    Period Weeks    Status Achieved      PT LONG TERM GOAL #4   Title Patient will demonstrate 4/5 or greater left shoulder MMT in all planes to improve stability during functional tasks.    Time 6    Period Weeks    Status On-going      PT LONG TERM GOAL #5   Title Patient will be independent with ADLs and with pain less than or equal to 2/10 in left shoulder.    Time 6    Period Weeks    Status On-going   average 3-4/10 with certain  activities.                Plan - 06/11/20 9741    Clinical Impression Statement Patient presented in clinic with no new complaints regarding his shoulder. Patient able to complete ADLs and reaching at home with only reports of it not "being easy" at times. Patient guided through continued periscapular strengthening to compensate for deficient rotator cuff. Patient's greatest limitation being muscle fatigue. Normal vasopnuematic response noted following removal of the modality. 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    Personal Factors and Comorbidities Age;Comorbidity 1    Comorbidities left RTC repair 12/10/2019 (23 weeks post op 05/19/2020)    Examination-Activity Limitations Bathing;Reach Overhead;Carry;Dressing;Hygiene/Grooming;Lift    Stability/Clinical Decision Making Stable/Uncomplicated    Rehab Potential Excellent    PT Frequency 3x / week    PT Duration 6 weeks    PT Treatment/Interventions ADLs/Self Care Home Management;Cryotherapy;Electrical Stimulation;Moist Heat;Ultrasound;Neuromuscular re-education;Therapeutic activities;Therapeutic exercise;Functional mobility training;Patient/family education;Manual techniques;Vasopneumatic Device;Taping;Passive range of motion    PT Next Visit Plan Initiate more carrying and lifting exercises. Continue strengthening, stabilization and periscapular exercises for strength and function    PT Home Exercise Plan see patient education section    Consulted and Agree with Plan of Care Patient           Patient will benefit from skilled therapeutic intervention in order to improve the following deficits and impairments:  Decreased activity tolerance, Decreased strength, Decreased range of motion, Pain, Impaired UE functional use, Postural dysfunction  Visit Diagnosis: Muscle weakness (generalized)  Stiffness of left shoulder, not elsewhere classified     Problem List There are no problems to display for  this patient.   Marvell Fuller, PTA 06/11/2020, 8:27 AM  [facility] [address] [place], [place], [address] Phone: [phone]   Fax:  [phone]  Name: Alvin Sanchez MRN: [id number] Date of Birth: Apr 30, 1956

## 2020-06-16 ENCOUNTER — Other Ambulatory Visit: Payer: Self-pay

## 2020-06-16 ENCOUNTER — Ambulatory Visit: Payer: Worker's Compensation | Admitting: Physical Therapy

## 2020-06-16 ENCOUNTER — Encounter: Payer: Self-pay | Admitting: Physical Therapy

## 2020-06-16 DIAGNOSIS — M6281 Muscle weakness (generalized): Secondary | ICD-10-CM

## 2020-06-16 DIAGNOSIS — M25612 Stiffness of left shoulder, not elsewhere classified: Secondary | ICD-10-CM

## 2020-06-16 NOTE — Therapy (Signed)
[facility] [place], [place], [address] Phone: [phone]   Fax:  [phone]  Physical Therapy Treatment  Patient Details  Name: Alvin Sanchez MRN: [id number] Date of Birth: [birth year] Referring Provider (PT): Tania Ade, MD   Encounter Date: 06/16/2020   PT End of Session - 06/16/20 0733    Visit Number 18    Number of Visits 50    Date for PT Re-Evaluation 06/17/20    Authorization Type Worker's compensation 67 approved visits    Authorization - Visit Number 1    Authorization - Number of Visits 50    PT Start Time 0731    PT Stop Time 0813    PT Time Calculation (min) 42 min    Activity Tolerance Patient tolerated treatment well    Behavior During Therapy [facility] for tasks assessed/performed           Past Medical History:  Diagnosis Date  . Anxiety   . Kidney stones     Past Surgical History:  Procedure Laterality Date  . FRACTURE SURGERY     toe  . ROTATOR CUFF REPAIR Left 12/10/2019  . THYROIDECTOMY Right 02/04/2011   Dr. Hardie Lora, [facility]    There were no vitals filed for this visit.   Subjective Assessment - 06/16/20 0733    Subjective COVID-19 screening performed upon arrival. Reports he cannot weedeat as long as he used to before stopping due to pain or fatigue. Also states that even at times has trouble reaching across the table for something light.    Pertinent History left RTC repair 12/10/2019    Limitations Lifting;House hold activities    Patient Stated Goals use arm normally again.    Currently in Pain? No/denies              [facility] PT Assessment - 06/16/20 0001      Assessment   Medical Diagnosis Traumatic rotator cuff tear left; shoulder impingement, left    Referring Provider (PT) Tania Ade, MD    Onset Date/Surgical Date 12/10/19    Hand Dominance Right    Next MD Visit 06/17/2020    Prior Therapy no      Precautions   Precautions Shoulder     Precaution Comments Follow RTC protocol see media      Observation/Other Assessments   Focus on Therapeutic Outcomes (FOTO)  44%, CK status      ROM / Strength   AROM / PROM / Strength Strength      Strength   Overall Strength Deficits    Strength Assessment Site Shoulder    Right/Left Shoulder Left    Left Shoulder Flexion 4-/5    Left Shoulder Internal Rotation 4+/5    Left Shoulder External Rotation 4-/5                         [facility] Adult PT Treatment/Exercise - 06/16/20 0001      Shoulder Exercises: Prone   Other Prone Exercises LUE AROM scaption, abduction, inverted V x20 reps       Shoulder Exercises: Standing   Protraction Strengthening;Left;Theraband;10 reps;20 reps    Theraband Level (Shoulder Protraction) Level 1 (Yellow)    External Rotation Strengthening;Left;20 reps;Theraband    Theraband Level (Shoulder External Rotation) Level 1 (Yellow)    External Rotation Limitations walk outs    Internal Rotation Strengthening;Left;Theraband;20 reps;10 reps    Theraband Level (Shoulder Internal Rotation) Level 1 (Yellow)  Extension Strengthening;Both;20 reps    Extension Limitations Blue XTS    Row Strengthening;Both;20 reps    Row Limitations Blue XTS      Shoulder Exercises: ROM/Strengthening   UBE (Upper Arm Bike) 30 RPM x8 min    Wall Pushups 20 reps;10 reps    Other ROM/Strengthening Exercises wall walks yellow untied band x2 RT      Modalities   Modalities Vasopneumatic      Vasopneumatic   Number Minutes Vasopneumatic  10 minutes    Vasopnuematic Location  Shoulder    Vasopneumatic Pressure Low    Vasopneumatic Temperature  34                       PT Long Term Goals - 06/16/20 0748      PT LONG TERM GOAL #1   Title Patient will be independent with HEP    Time 6    Period Weeks    Status Achieved      PT LONG TERM GOAL #2   Title Patient will demonstrate 140+ degrees of left shoulder flexion AROM to improve ability to  perform overhead tasks.    Time 6    Period Weeks    Status Achieved   but with compensatory motions achieve     PT LONG TERM GOAL #3   Title Patient will demonstrate 55+ degrees of left shoulder ER AROM to improve donning and doffing apparel.    Time 6    Period Weeks    Status Achieved      PT LONG TERM GOAL #4   Title Patient will demonstrate 4/5 or greater left shoulder MMT in all planes to improve stability during functional tasks.    Time 6    Period Weeks    Status Not Met      PT LONG TERM GOAL #5   Title Patient will be independent with ADLs and with pain less than or equal to 2/10 in left shoulder.    Time 6    Period Weeks    Status Partially Met   average 0-2/72 with certain activities.                Plan - 06/16/20 0811    Clinical Impression Statement Patient presented in clinic with no complaints of pain but does still demonstrate muscle weakness and fatigue. Patient limited intermittantly with pain in L shoulder if reaching or after prolonged period. Periscapular strengthening has been progressed to surpass the absence of bicep strength. Audible and intermittant popping/crepitus notable during treatment. Greatest weakness noted with shoulder flexion as well as ER currently. Able to achieve all LTGs except for MMT and ADLs goal due to intermittant pain. Normal vasopnuematic response noted following removal of the modality.    Personal Factors and Comorbidities Age;Comorbidity 1    Comorbidities left RTC repair 12/10/2019 (23 weeks post op 05/19/2020)    Examination-Activity Limitations Bathing;Reach Overhead;Carry;Dressing;Hygiene/Grooming;Lift    Stability/Clinical Decision Making Stable/Uncomplicated    Rehab Potential Excellent    PT Frequency 3x / week    PT Duration 6 weeks    PT Treatment/Interventions ADLs/Self Care Home Management;Cryotherapy;Electrical Stimulation;Moist Heat;Ultrasound;Neuromuscular re-education;Therapeutic activities;Therapeutic  exercise;Functional mobility training;Patient/family education;Manual techniques;Vasopneumatic Device;Taping;Passive range of motion    PT Next Visit Plan Continue or D/C per MD discretion.    PT Home Exercise Plan see patient education section    Consulted and Agree with Plan of Care Patient  Patient will benefit from skilled therapeutic intervention in order to improve the following deficits and impairments:  Decreased activity tolerance, Decreased strength, Decreased range of motion, Pain, Impaired UE functional use, Postural dysfunction  Visit Diagnosis: Muscle weakness (generalized)  Stiffness of left shoulder, not elsewhere classified     Problem List There are no problems to display for this patient.   Standley Brooking, PTA 06/16/20 8:26 AM   [facility] [address] [place], [place], [address] Phone: [phone]   Fax:  [phone]  Name: Alvin Sanchez MRN: [id number] Date of Birth: 01-10-1956

## 2020-06-22 ENCOUNTER — Other Ambulatory Visit: Payer: Self-pay

## 2020-06-22 ENCOUNTER — Ambulatory Visit: Payer: Worker's Compensation | Admitting: Physical Therapy

## 2020-06-22 ENCOUNTER — Encounter: Payer: Self-pay | Admitting: Physical Therapy

## 2020-06-22 DIAGNOSIS — M6281 Muscle weakness (generalized): Secondary | ICD-10-CM | POA: Diagnosis not present

## 2020-06-22 DIAGNOSIS — M25612 Stiffness of left shoulder, not elsewhere classified: Secondary | ICD-10-CM

## 2020-06-22 NOTE — Therapy (Signed)
[facility] [place], [place], [address] Phone: [phone]   Fax:  [phone]  Physical Therapy Treatment  PHYSICAL THERAPY DISCHARGE SUMMARY  Visits from Start of Care: 50  Current functional level related to goals / functional outcomes: See below   Remaining deficits: See goals   Education / Equipment: HEP Plan: Patient agrees to discharge.  Patient goals were partially met. Patient is being discharged due to meeting the stated rehab goals.  ?????    [name], PT, DPT   Patient Details  Name: Alvin Sanchez MRN: [id number] Date of Birth: [birth year] Referring Provider (PT): Tania Ade, MD   Encounter Date: 06/22/2020   PT End of Session - 06/22/20 0738    Visit Number 50    Number of Visits 50    Date for PT Re-Evaluation 06/17/20    Authorization Type Worker's compensation 65 approved visits    Authorization - Visit Number 51    Authorization - Number of Visits 26    PT Start Time 0731    PT Stop Time 0814    PT Time Calculation (min) 43 min    Activity Tolerance Patient tolerated treatment well    Behavior During Therapy [facility] for tasks assessed/performed           Past Medical History:  Diagnosis Date  . Anxiety   . Kidney stones     Past Surgical History:  Procedure Laterality Date  . FRACTURE SURGERY     toe  . ROTATOR CUFF REPAIR Left 12/10/2019  . THYROIDECTOMY Right 02/04/2011   Dr. Hardie Lora, [facility]    There were no vitals filed for this visit.   Subjective Assessment - 06/22/20 0738    Subjective COVID-19 screening performed upon arrival. No new complaints.    Pertinent History left RTC repair 12/10/2019    Limitations Lifting;House hold activities    Patient Stated Goals use arm normally again.    Currently in Pain? No/denies              [facility] PT Assessment - 06/22/20 0001      Assessment   Medical Diagnosis Traumatic rotator cuff tear left;  shoulder impingement, left    Referring Provider (PT) Tania Ade, MD    Onset Date/Surgical Date 12/10/19    Hand Dominance Right    Prior Therapy no      Precautions   Precautions Shoulder    Precaution Comments Follow RTC protocol see media                         [facility] Adult PT Treatment/Exercise - 06/22/20 0001      Shoulder Exercises: Prone   Retraction Strengthening;Left;20 reps;10 reps;Weights    Retraction Weight (lbs) 4    Extension Strengthening;Left;20 reps;10 reps;Weights    Extension Weight (lbs) 4      Shoulder Exercises: Standing   Protraction Strengthening;Left;Theraband;10 reps;20 reps    Theraband Level (Shoulder Protraction) Level 1 (Yellow)    External Rotation Strengthening;Left;20 reps;Theraband    Theraband Level (Shoulder External Rotation) Level 1 (Yellow)    External Rotation Limitations walk outs    Internal Rotation Strengthening;Left;Theraband;20 reps;10 reps    Theraband Level (Shoulder Internal Rotation) Level 1 (Yellow)    Extension Strengthening;Both;20 reps    Extension Limitations Blue XTS    Row Strengthening;Both;20 reps    Row Limitations Blue XTS      Shoulder Exercises: Pulleys   Flexion 5 minutes  Shoulder Exercises: ROM/Strengthening   UBE (Upper Arm Bike) 60 RPM x10 min    Wall Pushups 20 reps    Ball on Wall CW and CCW circles x10 reps each    Other ROM/Strengthening Exercises wall walks yellow untied band x2 RT      Modalities   Modalities Vasopneumatic      Vasopneumatic   Number Minutes Vasopneumatic  10 minutes    Vasopnuematic Location  Shoulder    Vasopneumatic Pressure Low    Vasopneumatic Temperature  34                       PT Long Term Goals - 06/16/20 0748      PT LONG TERM GOAL #1   Title Patient will be independent with HEP    Time 6    Period Weeks    Status Achieved      PT LONG TERM GOAL #2   Title Patient will demonstrate 140+ degrees of left shoulder flexion  AROM to improve ability to perform overhead tasks.    Time 6    Period Weeks    Status Achieved   but with compensatory motions achieve     PT LONG TERM GOAL #3   Title Patient will demonstrate 55+ degrees of left shoulder ER AROM to improve donning and doffing apparel.    Time 6    Period Weeks    Status Achieved      PT LONG TERM GOAL #4   Title Patient will demonstrate 4/5 or greater left shoulder MMT in all planes to improve stability during functional tasks.    Time 6    Period Weeks    Status Not Met      PT LONG TERM GOAL #5   Title Patient will be independent with ADLs and with pain less than or equal to 2/10 in left shoulder.    Time 6    Period Weeks    Status Partially Met   average 1-6/07 with certain activities.                Plan - 06/22/20 0814    Clinical Impression Statement Patient presented in clinic with reports of no current L shoulder pain. Patient does fatigue quickly with propriocpetive exercises at shoulder height. Patient has been progressed throughout PT of improving strength especially periscapular strengthening to assist with overcoming deficits. No functional complaints were reported by patient today. Normal vasopnuematic response noted following removal of the modality.    Personal Factors and Comorbidities Age;Comorbidity 1    Comorbidities left RTC repair 12/10/2019 (23 weeks post op 05/19/2020)    Examination-Activity Limitations Bathing;Reach Overhead;Carry;Dressing;Hygiene/Grooming;Lift    Stability/Clinical Decision Making Stable/Uncomplicated    Rehab Potential Excellent    PT Frequency 3x / week    PT Duration 6 weeks    PT Treatment/Interventions ADLs/Self Care Home Management;Cryotherapy;Electrical Stimulation;Moist Heat;Ultrasound;Neuromuscular re-education;Therapeutic activities;Therapeutic exercise;Functional mobility training;Patient/family education;Manual techniques;Vasopneumatic Device;Taping;Passive range of motion    PT Next  Visit Plan D/C summary required.    PT Home Exercise Plan see patient education section    Consulted and Agree with Plan of Care Patient           Patient will benefit from skilled therapeutic intervention in order to improve the following deficits and impairments:  Decreased activity tolerance, Decreased strength, Decreased range of motion, Pain, Impaired UE functional use, Postural dysfunction  Visit Diagnosis: Muscle weakness (generalized)  Stiffness of left shoulder, not elsewhere classified  Problem List There are no problems to display for this patient.   Standley Brooking, PTA 06/22/20 8:21 AM   [facility] [address] [place], [place], [address] Phone: [phone]   Fax:  [phone]  Name: Alvin Sanchez MRN: [id number] Date of Birth: [birth year]
